# Patient Record
Sex: Male | Born: 1990 | Race: White | Hispanic: No | Marital: Married | Smoking: Never smoker
Health system: Southern US, Community
[De-identification: ages and names within clinical notes are randomized; demographics above are authoritative.]

## PROBLEM LIST (undated history)

## (undated) DIAGNOSIS — F419 Anxiety disorder, unspecified: Secondary | ICD-10-CM

## (undated) DIAGNOSIS — R519 Headache, unspecified: Secondary | ICD-10-CM

## (undated) DIAGNOSIS — I10 Essential (primary) hypertension: Secondary | ICD-10-CM

## (undated) DIAGNOSIS — F32A Depression, unspecified: Secondary | ICD-10-CM

## (undated) DIAGNOSIS — G4730 Sleep apnea, unspecified: Secondary | ICD-10-CM

---

## 2015-01-22 ENCOUNTER — Encounter: Payer: Self-pay | Admitting: Emergency Medicine

## 2015-01-22 ENCOUNTER — Emergency Department
Admission: EM | Admit: 2015-01-22 | Discharge: 2015-01-23 | Disposition: A | Discharge status: Home / Self Care | Payer: No Typology Code available for payment source | Attending: Emergency Medicine | Admitting: Emergency Medicine

## 2015-01-22 DIAGNOSIS — R509 Fever, unspecified: Secondary | ICD-10-CM | POA: Diagnosis present

## 2015-01-22 DIAGNOSIS — J020 Streptococcal pharyngitis: Secondary | ICD-10-CM | POA: Insufficient documentation

## 2015-01-22 MED ORDER — ACETAMINOPHEN 500 MG PO TABS
1000.0000 mg | ORAL_TABLET | Freq: Once | ORAL | Status: AC
  Administered 2015-01-22: 1000 mg via ORAL
  Filled 2015-01-22: qty 2

## 2015-01-22 MED ORDER — DEXAMETHASONE SODIUM PHOSPHATE 10 MG/ML IJ SOLN
10.0000 mg | Freq: Once | INTRAMUSCULAR | Status: AC
  Administered 2015-01-22: 10 mg via INTRAMUSCULAR
  Filled 2015-01-22: qty 1

## 2015-01-22 MED ORDER — PENICILLIN G BENZATHINE 1200000 UNIT/2ML IM SUSP
1.2000 10*6.[IU] | Freq: Once | INTRAMUSCULAR | Status: AC
  Administered 2015-01-22: 1.2 10*6.[IU] via INTRAMUSCULAR
  Filled 2015-01-22: qty 2

## 2015-01-22 NOTE — ED Notes (Signed)
Pt presents to ED via POV with c/o of sore throat, fever, and generalized body aches. Pt states he has had these presenting sx since yesterday. Pt states he has taken OTC medications for the sx, with no relief. Pt is alert and oriented x4. Pt states he believes he has the "flu".

## 2015-01-23 NOTE — Discharge Instructions (Signed)
Fever, Adult A fever is a temperature of 100.4 F (38 C) or above.  HOME CARE  Take fever medicine as told by your doctor. Do not  take aspirin for fever if you are younger than 24 years of age.  If you are given antibiotic medicine, take it as told. Finish the medicine even if you start to feel better.  Rest.  Drink enough fluids to keep your pee (urine) clear or pale yellow. Do not drink alcohol.  Take a bath or shower with room temperature water. Do not use ice water or alcohol sponge baths.  Wear lightweight, loose clothes. GET HELP RIGHT AWAY IF:   You are short of breath or have trouble breathing.  You are very weak.  You are dizzy or you pass out (faint).  You are very thirsty or are making little or no urine.  You have new pain.  You throw up (vomit) or have watery poop (diarrhea).  You keep throwing up or having watery poop for more than 1 to 2 days.  You have a stiff neck or light bothers your eyes.  You have a skin rash.  You have a fever or problems (symptoms) that last for more than 2 to 3 days.  You have a fever and your problems quickly get worse.  You keep throwing up the fluids you drink.  You do not feel better after 3 days.  You have new problems. MAKE SURE YOU:   Understand these instructions.  Will watch your condition.  Will get help right away if you are not doing well or get worse. Document Released: 03/28/2008 Document Revised: 09/11/2011 Document Reviewed: 04/20/2011 [facility] Patient Information 2015 [place], [place]. This information is not intended to replace advice given to you by your health care provider. Make sure you discuss any questions you have with your health care provider.  Pharyngitis Pharyngitis is a sore throat (pharynx). There is redness, pain, and swelling of your throat. HOME CARE   Drink enough fluids to keep your pee (urine) clear or pale yellow.  Only take medicine as told by your doctor.  You may get sick  again if you do not take medicine as told. Finish your medicines, even if you start to feel better.  Do not take aspirin.  Rest.  Rinse your mouth (gargle) with salt water ( tsp of salt per 1 qt of water) every 1-2 hours. This will help the pain.  If you are not at risk for choking, you can suck on hard candy or sore throat lozenges. GET HELP IF:  You have large, tender lumps on your neck.  You have a rash.  You cough up green, yellow-brown, or bloody spit. GET HELP RIGHT AWAY IF:   You have a stiff neck.  You drool or cannot swallow liquids.  You throw up (vomit) or are not able to keep medicine or liquids down.  You have very bad pain that does not go away with medicine.  You have problems breathing (not from a stuffy nose). MAKE SURE YOU:   Understand these instructions.  Will watch your condition.  Will get help right away if you are not doing well or get worse. Document Released: 12/06/2007 Document Revised: 04/09/2013 Document Reviewed: 02/24/2013 [facility] Patient Information 2015 [place], [place]. This information is not intended to replace advice given to you by your health care provider. Make sure you discuss any questions you have with your health care provider.

## 2015-01-23 NOTE — ED Provider Notes (Signed)
CSN: [id number]     Arrival date & time 01/22/15  2024 History   First MD Initiated Contact with Patient 01/22/15 2323     Chief Complaint  Patient presents with  . Generalized Body Aches  . Sore Throat  . Fever     (Consider location/radiation/quality/duration/timing/severity/associated sxs/prior Treatment) HPI 24 year old male with 2 day history of sore throat and fevers and body aches. Patient has had fevers to 102. He was began today. Sore throat pain is 8 out of 10. He has not taken Tylenol or ibuprofen. He is able tolerate by mouth well. No abdominal pain, pain, shortness of breath, rashes.   History reviewed. No pertinent past medical history. No past surgical history on file. No family history on file. History  Substance Use Topics  . Smoking status: Never Smoker   . Smokeless tobacco: Not on file  . Alcohol Use: Yes    Review of Systems  Constitutional: Positive for fever. Negative for chills, activity change and appetite change.  HENT: Positive for sore throat. Negative for congestion, ear pain, mouth sores, rhinorrhea, sinus pressure and trouble swallowing.   Eyes: Negative for photophobia, pain and discharge.  Respiratory: Negative for cough, chest tightness and shortness of breath.   Cardiovascular: Negative for chest pain and leg swelling.  Gastrointestinal: Negative for nausea, vomiting, abdominal pain, diarrhea and abdominal distention.  Genitourinary: Negative for dysuria and difficulty urinating.  Musculoskeletal: Negative for back pain, arthralgias and gait problem.  Skin: Negative for color change and rash.  Neurological: Negative for dizziness and headaches.  Hematological: Negative for adenopathy.  Psychiatric/Behavioral: Negative for behavioral problems and agitation.      Allergies  Soy allergy  Home Medications   Prior to Admission medications   Not on File   BP 115/65 mmHg  Pulse 98  Temp(Src) 99.4 F (37.4 C) (Oral)  Resp 20  Ht 5\' 7"   (1.702 m)  Wt 170 lb (77.111 kg)  BMI 26.62 kg/m2  SpO2 98% Physical Exam  Constitutional: He is oriented to person, place, and time. He appears well-developed and well-nourished.  HENT:  Head: Normocephalic and atraumatic.  Mouth/Throat: Uvula is midline and mucous membranes are normal. No oral lesions. No trismus in the jaw. Normal dentition. No dental abscesses, uvula swelling or dental caries. Oropharyngeal exudate, posterior oropharyngeal edema and posterior oropharyngeal erythema present. No tonsillar abscesses.  Eyes: Conjunctivae and EOM are normal. Pupils are equal, round, and reactive to light.  Neck: Normal range of motion. Neck supple.  Cardiovascular: Normal rate, regular rhythm, normal heart sounds and intact distal pulses.   Pulmonary/Chest: Effort normal and breath sounds normal. No respiratory distress. He has no wheezes. He has no rales. He exhibits no tenderness.  Abdominal: Soft. Bowel sounds are normal. He exhibits no distension. There is no tenderness.  Musculoskeletal: Normal range of motion. He exhibits no edema or tenderness.  Neurological: He is alert and oriented to person, place, and time.  Skin: Skin is warm and dry.  Psychiatric: He has a normal mood and affect. His behavior is normal. Judgment and thought content normal.    ED Course  Procedures (including critical care time) Labs Review Labs Reviewed - No data to display  Imaging Review No results found.   EKG Interpretation None      MDM   Final diagnoses:  Strep pharyngitis  Fever, unspecified fever cause    24 year old male with fevers and pharyngitis 2 days. Rapid strep test was positive. Fever was proved with Tylenol. Patient  was given 10 mg of dexamethasone IM followed by 1.2 million units of penicillin G IM. He'll continue Tylenol and ibuprofen as needed for fevers. Turn to the ER for any worsening symptoms or urgent changes in his health.    Evon Slack, PA-C 01/23/15  0021  Myrna Blazer, MD 01/23/15 [phone]

## 2015-01-26 LAB — POCT RAPID STREP A: Streptococcus, Group A Screen (Direct): POSITIVE — AB

## 2018-11-19 DIAGNOSIS — G43709 Chronic migraine without aura, not intractable, without status migrainosus: Secondary | ICD-10-CM | POA: Insufficient documentation

## 2019-10-26 ENCOUNTER — Emergency Department: Payer: 59

## 2019-10-26 ENCOUNTER — Other Ambulatory Visit: Payer: Self-pay

## 2019-10-26 DIAGNOSIS — I10 Essential (primary) hypertension: Secondary | ICD-10-CM | POA: Insufficient documentation

## 2019-10-26 DIAGNOSIS — R05 Cough: Secondary | ICD-10-CM | POA: Diagnosis present

## 2019-10-26 DIAGNOSIS — U071 COVID-19: Secondary | ICD-10-CM | POA: Insufficient documentation

## 2019-10-26 LAB — CBC
HCT: 41.7 % (ref 39.0–52.0)
Hemoglobin: 14.9 g/dL (ref 13.0–17.0)
MCH: 31.6 pg (ref 26.0–34.0)
MCHC: 35.7 g/dL (ref 30.0–36.0)
MCV: 88.3 fL (ref 80.0–100.0)
Platelets: 141 10*3/uL — ABNORMAL LOW (ref 150–400)
RBC: 4.72 MIL/uL (ref 4.22–5.81)
RDW: 11.7 % (ref 11.5–15.5)
WBC: 5.4 10*3/uL (ref 4.0–10.5)
nRBC: 0 % (ref 0.0–0.2)

## 2019-10-26 LAB — BASIC METABOLIC PANEL
Anion gap: 8 (ref 5–15)
BUN: 11 mg/dL (ref 6–20)
CO2: 28 mmol/L (ref 22–32)
Calcium: 8.5 mg/dL — ABNORMAL LOW (ref 8.9–10.3)
Chloride: 102 mmol/L (ref 98–111)
Creatinine, Ser: 0.94 mg/dL (ref 0.61–1.24)
GFR calc Af Amer: >60 mL/min (ref >60)
GFR calc non Af Amer: >60 mL/min (ref >60)
Glucose, Bld: 105 mg/dL — ABNORMAL HIGH (ref 70–99)
Potassium: 3.2 mmol/L — ABNORMAL LOW (ref 3.5–5.1)
Sodium: 138 mmol/L (ref 135–145)

## 2019-10-26 LAB — TROPONIN I (HIGH SENSITIVITY): Troponin I (High Sensitivity): <2 ng/L (ref <18)

## 2019-10-26 NOTE — ED Triage Notes (Signed)
Pt reports that he has been experiencing a fever and cough since Friday. PT states that his Wife and children are at home with COVId-19. Pt states that he comes to ED today due to inability to lower fever at home, pt reports fever of 100.2 and up, states he has taken ibuprofen and cold showers to help with fever with no success. Pt also reports some chest tightness with cough.

## 2019-10-27 ENCOUNTER — Emergency Department
Admission: EM | Admit: 2019-10-27 | Discharge: 2019-10-27 | Disposition: A | Discharge status: Home / Self Care | Payer: 59 | Attending: Emergency Medicine | Admitting: Emergency Medicine

## 2019-10-27 DIAGNOSIS — U071 COVID-19: Secondary | ICD-10-CM

## 2019-10-27 HISTORY — DX: Essential (primary) hypertension: I10

## 2019-10-27 LAB — POC SARS CORONAVIRUS 2 AG: SARS Coronavirus 2 Ag: POSITIVE — AB

## 2019-10-27 LAB — TROPONIN I (HIGH SENSITIVITY): Troponin I (High Sensitivity): <2 ng/L (ref <18)

## 2019-10-27 LAB — FIBRIN DERIVATIVES D-DIMER (ARMC ONLY): Fibrin derivatives D-dimer (ARMC): 278.72 ng/mL (FEU) (ref 0.00–499.00)

## 2019-10-27 MED ORDER — BENZONATATE 100 MG PO CAPS: 100.0000 mg | ORAL_CAPSULE | Freq: Three times a day (TID) | ORAL | 0 refills | Status: DC | PRN

## 2019-10-27 MED ORDER — ONDANSETRON 4 MG PO TBDP: 4.0000 mg | ORAL_TABLET | Freq: Three times a day (TID) | ORAL | 0 refills | Status: DC | PRN

## 2019-10-27 MED ORDER — IBUPROFEN 600 MG PO TABS
600.0000 mg | ORAL_TABLET | Freq: Once | ORAL | Status: AC
  Administered 2019-10-27: 600 mg via ORAL
  Filled 2019-10-27: qty 1

## 2019-10-27 NOTE — ED Provider Notes (Signed)
[facility] Emergency Department Provider Note  ____________________________________________  Time seen: Approximately 3:50 AM  I have reviewed the triage vital signs and the nursing notes.   HISTORY  Chief Complaint Cough and Fever   HPI Shawn Medina is a 29 y.o. male the history of hypertension who presents for evaluation of Covid-like symptoms.  Patient reports that his wife and children tested positive for Covid.  He started having symptoms 2 days ago.  He has had fever, cough, chest tightness, body aches.  He denies pleuritic chest pain, vomiting or diarrhea, shortness of breath.  No personal or family history of blood clots, no leg pain or swelling or hemoptysis.  Has been taking Tylenol and ibuprofen for fever.  Has not been tested for Covid.   Past Medical History:  Diagnosis Date  . Hypertension     Allergies Soy allergy  History reviewed. No pertinent family history.  Social History Social History   Tobacco Use  . Smoking status: Never Smoker  . Smokeless tobacco: Never Used  Substance Use Topics  . Alcohol use: Never  . Drug use: No    Review of Systems  Constitutional: + fever. Eyes: Negative for visual changes. ENT: Negative for sore throat. Neck: No neck pain  Cardiovascular: Negative for chest pain. + chest tightness Respiratory: Negative for shortness of breath. + cough Gastrointestinal: Negative for abdominal pain, vomiting or diarrhea. Genitourinary: Negative for dysuria. Musculoskeletal: Negative for back pain. Skin: Negative for rash. Neurological: Negative for headaches, weakness or numbness. Psych: No SI or HI  ____________________________________________   PHYSICAL EXAM:  VITAL SIGNS: ED Triage Vitals  Enc Vitals Group     BP 10/26/19 2204 (!) 145/82     Pulse Rate 10/26/19 2204 (!) 102     Resp 10/26/19 2204 (!) 22     Temp 10/26/19 2204 100.2 F (37.9 C)     Temp Source 10/26/19 2204 Oral      SpO2 10/26/19 2204 99 %     Weight 10/26/19 2205 210 lb (95.3 kg)     Height 10/26/19 2205 5\' 8"  (1.727 m)     Head Circumference --      Peak Flow --      Pain Score 10/26/19 2204 5     Pain Loc --      Pain Edu? --      Excl. in GC? --     Constitutional: Alert and oriented. Well appearing and in no apparent distress. HEENT:      Head: Normocephalic and atraumatic.         Eyes: Conjunctivae are normal. Sclera is non-icteric.       Mouth/Throat: Mucous membranes are moist.       Neck: Supple with no signs of meningismus. Cardiovascular: Tachycardic with regular rhythm with no murmurs, no JVD. Respiratory: Normal respiratory effort. Lungs are clear to auscultation bilaterally. No wheezes, crackles, or rhonchi.  Gastrointestinal: Soft, non tender. Musculoskeletal:  No edema, cyanosis, or erythema of extremities. Neurologic: Normal speech and language. Face is symmetric. Moving all extremities. No gross focal neurologic deficits are appreciated. Skin: Skin is warm, dry and intact. No rash noted. Psychiatric: Mood and affect are normal. Speech and behavior are normal.  ____________________________________________   LABS (all labs ordered are listed, but only abnormal results are displayed)  Labs Reviewed  BASIC METABOLIC PANEL - Abnormal; Notable for the following components:      Result Value   Potassium 3.2 (*)  Glucose, Bld 105 (*)    Calcium 8.5 (*)    All other components within normal limits  CBC - Abnormal; Notable for the following components:   Platelets 141 (*)    All other components within normal limits  POC SARS CORONAVIRUS 2 AG - Abnormal; Notable for the following components:   SARS Coronavirus 2 Ag POSITIVE (*)    All other components within normal limits  FIBRIN DERIVATIVES D-DIMER ([facility] ONLY)  POC SARS CORONAVIRUS 2 AG -  ED  TROPONIN I (HIGH SENSITIVITY)  TROPONIN I (HIGH SENSITIVITY)   ____________________________________________  EKG  ED ECG  REPORT I, Rudene Re, the attending physician, personally viewed and interpreted this ECG.  Normal sinus rhythm, rate of 97, normal intervals, right axis deviation, Q waves in inferior leads with T wave inversions, no ST elevation.  No prior for comparison. ____________________________________________  RADIOLOGY  I have personally reviewed the images performed during this visit and I agree with the Radiologist's read.   Interpretation by Radiologist:  DG Chest 2 View  Result Date: 10/26/2019 CLINICAL DATA:  Fever and cough. EXAM: CHEST - 2 VIEW COMPARISON:  None. FINDINGS: The heart size and mediastinal contours are within normal limits. Both lungs are clear. The visualized skeletal structures are unremarkable. IMPRESSION: No active cardiopulmonary disease. Electronically Signed   By: Virgina Norfolk M.D.   On: 10/26/2019 22:29     ____________________________________________   PROCEDURES  Procedure(s) performed:yes .1-3 Lead EKG Interpretation Performed by: Rudene Re, MD Authorized by: Rudene Re, MD     Interpretation: abnormal     ECG rate:  90   ECG rate assessment: normal     Rhythm: sinus rhythm     Ectopy: none     Conduction: normal     Critical Care performed:  None ____________________________________________   INITIAL IMPRESSION / ASSESSMENT AND PLAN / ED COURSE  29 y.o. male the history of hypertension who presents for evaluation of Covid-like symptoms.  Family tested positive for Covid.  Patient is extremely well-appearing in no distress, has a low-grade fever and mild tachycardia but normal work of breathing, normal sats and clear lungs.  No asymmetric leg swelling.  He looks euvolemic.  EKG showing inferior Q waves with T wave inversions but no prior for comparison.  No evidence of STEMI.  High-sensitivity troponin x2 -with no signs of Covid myocarditis.  Chest x-ray visualized and read by me as negative with no signs of Covid  pneumonia, confirmed by radiology.  No leukocytosis, mild thrombocytopenia with platelets of 141.  No significant electrolyte derangements.  D-dimer negative.  Covid swab +. No oxygen requirement both at rest and with ambulation.  Discussed management of symptoms at home, monitoring of oxygen saturations several times a day, follow-up with PCP, and my standard return precautions.  Will provide patient with Tessalon Perles, Zofran.  Old medical records reviewed.      _____________________________________________ Please note:  Patient was evaluated in Emergency Department today for the symptoms described in the history of present illness. Patient was evaluated in the context of the global COVID-19 pandemic, which necessitated consideration that the patient might be at risk for infection with the SARS-CoV-2 virus that causes COVID-19. Institutional protocols and algorithms that pertain to the evaluation of patients at risk for COVID-19 are in a state of rapid change based on information released by regulatory bodies including the CDC and federal and state organizations. These policies and algorithms were followed during the patient's care in the ED.  Some ED evaluations and interventions may be delayed as a result of limited staffing during the pandemic.   [place] Controlled Substance Database was reviewed by me. ____________________________________________   FINAL CLINICAL IMPRESSION(S) / ED DIAGNOSES   Final diagnoses:  COVID-19      NEW MEDICATIONS STARTED DURING THIS VISIT:  ED Discharge Orders         Ordered    benzonatate (TESSALON PERLES) 100 MG capsule  3 times daily PRN     10/27/19 0459    ondansetron (ZOFRAN ODT) 4 MG disintegrating tablet  Every 8 hours PRN     10/27/19 0459           Note:  This document was prepared using Dragon voice recognition software and may include unintentional dictation errors.    Don Perking, [place], MD 10/27/19 [phone]

## 2019-10-27 NOTE — Discharge Instructions (Signed)
QUARANTINE INSTRUCTION  Follow these instructions at home:  Protecting others To avoid spreading the illness to other people: Quarantine in your home until you have had no cough and fever for 7 days. Household members should also be quarantine for at least 14 days after being exposed to you. Wash your hands often with soap and water. If soap and water are not available, use an alcohol-based hand sanitizer. If you have not cleaned your hands, do not touch your face. Make sure that all people in your household wash their hands well and often. Cover your nose and mouth when you cough or sneeze. Throw away used tissues. Stay home if you have any cold-like or flu-like symptoms. General instructions Go to your local pharmacy and buy a pulse oximeter (this is a machine that measures your oxygen). Check your oxygen levels at least 3 times a day. If your oxygen level is 90% or less return to the emergency room immediately Take over-the-counter and prescription medicines only as told by your health care provider. If you need medication for fever take tylenol or ibuprofen Drink enough fluid to keep your urine pale yellow. Rest at home as directed by your health care provider. Do not give aspirin to a child with the flu, because of the association with Reye's syndrome. Do not use tobacco products, including cigarettes, chewing tobacco, and e-cigarettes. If you need help quitting, ask your health care provider. Keep all follow-up visits as told by your health care provider. This is important. How is this prevented? Avoid areas where an outbreak has been reported. Avoid large groups of people. Keep a safe distance from people who are coughing and sneezing. Do not touch your face if you have not cleaned your hands. When you are around people who are sick or might be sick, wear a mask to protect yourself. Contact a health care provider if: You have symptoms of SARS (cough, fever, chest pain, shortness of  breath) that are not getting better at home. You have a fever. If you have difficulty breathing go to your local ER or call 911   

## 2020-01-07 ENCOUNTER — Encounter: Payer: Self-pay | Admitting: *Deleted

## 2020-01-19 ENCOUNTER — Other Ambulatory Visit: Payer: Self-pay

## 2020-01-19 ENCOUNTER — Encounter: Payer: Self-pay | Admitting: Cardiology

## 2020-01-19 ENCOUNTER — Ambulatory Visit (INDEPENDENT_AMBULATORY_CARE_PROVIDER_SITE_OTHER): Payer: No Typology Code available for payment source | Admitting: Cardiology

## 2020-01-19 VITALS — BP 118/90 | HR 66 | Ht 68.0 in | Wt 204.1 lb

## 2020-01-19 DIAGNOSIS — I10 Essential (primary) hypertension: Secondary | ICD-10-CM

## 2020-01-19 DIAGNOSIS — R079 Chest pain, unspecified: Secondary | ICD-10-CM | POA: Diagnosis not present

## 2020-01-19 NOTE — Progress Notes (Signed)
Cardiology Office Note:    Date:  01/19/2020   ID:  Shawn Medina, DOB 1991/05/16, MRN [id number]  PCP:  Patient, No Pcp Per  [facility] Cardiologist:  Debbe Odea, MD  [facility] Electrophysiologist:  None   Referring MD: [facility]   Chief Complaint  Patient presents with   New Patient (Initial Visit)    Ref by Dr. Sande Brothers for HTN & chest pain. Meds reviewed by the pt. verbally. Pt. c/o chest pain today.    Shawn Medina is a 29 y.o. male who is being seen today for the evaluation of chestpain at the request of [facility].   History of Present Illness:    Shawn Medina is a 29 y.o. male with a hx of hypertension who presents due to chest pain.  Patient states having symptoms of chest discomfort over the past 3 months.  Symptoms of chest pain are not related with exertion and usually lasts a couple of seconds.  Symptoms are kind of dull.  Patient is in the [employer] and recently stationed in [place].  He is not sure if he is recent department or anxiety is contributing to his symptoms.  He states having right hip arthritis and recently started physical therapy to help with that.  Symptoms of chest discomfort.  He denies palpitations, shortness of breath, edema.  Denies any history of heart disease or family history of heart disease.  Denies smoking.  Past Medical History:  Diagnosis Date   Hypertension     History reviewed. No pertinent surgical history.  Current Medications: Current Meds  Medication Sig   cholecalciferol (VITAMIN D3) 25 MCG (1000 UNIT) tablet Take 1,000 Units by mouth daily.   ibuprofen (ADVIL) 400 MG tablet Take 400 mg by mouth every 6 (six) hours as needed.   ondansetron (ZOFRAN ODT) 4 MG disintegrating tablet Take 1 tablet (4 mg total) by mouth every 8 (eight) hours as needed.   propranolol (INDERAL) 60 MG tablet Take 60 mg by mouth daily.     Allergies:   Soy allergy   Social History    Socioeconomic History   Marital status: Married    Spouse name: Not on file   Number of children: Not on file   Years of education: Not on file   Highest education level: Not on file  Occupational History   Not on file  Tobacco Use   Smoking status: Never Smoker   Smokeless tobacco: Never Used  Vaping Use   Vaping Use: Never used  Substance and Sexual Activity   Alcohol use: Never   Drug use: No   Sexual activity: Not on file  Other Topics Concern   Not on file  Social History Narrative   Not on file   Social Determinants of Health   Financial Resource Strain:    Difficulty of Paying Living Expenses:   Food Insecurity:    Worried About Programme researcher, broadcasting/film/video in the Last Year:    Barista in the Last Year:   Transportation Needs:    Freight forwarder (Medical):    Lack of Transportation (Non-Medical):   Physical Activity:    Days of Exercise per Week:    Minutes of Exercise per Session:   Stress:    Feeling of Stress :   Social Connections:    Frequency of Communication with Friends and Family:    Frequency of Social Gatherings with Friends and Family:  Attends Religious Services:    Active Member of Clubs or Organizations:    Attends BankerClub or Organization Meetings:    Marital Status:      Family History: Patient denies any family history of heart disease.  ROS:   Please see the history of present illness.     All other systems reviewed and are negative.  EKGs/Labs/Other Studies Reviewed:    The following studies were reviewed today:   EKG:  EKG is  ordered today.  The ekg ordered today demonstrates normal sinus rhythm, normal ECG.  Recent Labs: 10/26/2019: BUN 11; Creatinine, Ser 0.94; Hemoglobin 14.9; Platelets 141; Potassium 3.2; Sodium 138  Recent Lipid Panel No results found for: CHOL, TRIG, HDL, CHOLHDL, VLDL, LDLCALC, LDLDIRECT  Physical Exam:    VS:  BP 118/90 (BP Location: Right Arm, Patient Position:  Sitting, Cuff Size: Normal)    Pulse 66    Ht 5\' 8"  (1.727 m)    Wt 204 lb 2 oz (92.6 kg)    SpO2 98%    BMI 31.04 kg/m     Wt Readings from Last 3 Encounters:  01/19/20 204 lb 2 oz (92.6 kg)  10/26/19 210 lb (95.3 kg)  01/22/15 170 lb (77.1 kg)     GEN:  Well nourished, well developed in no acute distress HEENT: Normal NECK: No JVD; No carotid bruits LYMPHATICS: No lymphadenopathy CARDIAC: RRR, no murmurs, rubs, gallops RESPIRATORY:  Clear to auscultation without rales, wheezing or rhonchi  ABDOMEN: Soft, non-tender, non-distended MUSCULOSKELETAL:  No edema; No deformity  SKIN: Warm and dry NEUROLOGIC:  Alert and oriented x 3 PSYCHIATRIC:  Normal affect   ASSESSMENT:    1. Chest pain of uncertain etiology   2. Essential hypertension   3. Chest pain, unspecified type    PLAN:    In order of problems listed above:  1. Patient with symptoms of atypical chest discomfort.  He has risk factors of hypertension.  He is in the [employer].  Will evaluate patient with echocardiogram for wall motion abnormalities.  Get a Lexiscan Myoview to evaluate presence of CAD. 2. History of hypertension, blood pressure well controlled.  Continue propanolol as prescribed.  Follow-up after echocardiogram and Myoview.   Medication Adjustments/Labs and Tests Ordered: Current medicines are reviewed at length with the patient today.  Concerns regarding medicines are outlined above.  Orders Placed This Encounter  Procedures   NM Myocar Multi W/Spect W/Wall Motion / EF   EKG 12-Lead   ECHOCARDIOGRAM COMPLETE   No orders of the defined types were placed in this encounter.   Patient Instructions  Medication Instructions:   Your physician recommends that you continue on your current medications as directed. Please refer to the Current Medication list given to you today.  *If you need a refill on your cardiac medications before your next appointment, please call your pharmacy*   Lab  Work: None Ordered If you have labs (blood work) drawn today and your tests are completely normal, you will receive your results only by:  MyChart Message (if you have MyChart) OR  A paper copy in the mail If you have any lab test that is abnormal or we need to change your treatment, we will call you to review the results.   Testing/Procedures:  1.  Your physician has requested that you have an echocardiogram. Echocardiography is a painless test that uses sound waves to create images of your heart. It provides your doctor with information about the size and shape of your  heart and how well your hearts chambers and valves are working. This procedure takes approximately one hour. There are no restrictions for this procedure.  2.  Your physician has requested that you have a lexiscan myoview.      [facility] MYOVIEW  Your caregiver has ordered a Stress Test with nuclear imaging. The purpose of this test is to evaluate the blood supply to your heart muscle. This procedure is referred to as a "Non-Invasive Stress Test." This is because other than having an IV started in your vein, nothing is inserted or "invades" your body. Cardiac stress tests are done to find areas of poor blood flow to the heart by determining the extent of coronary artery disease (CAD). Some patients exercise on a treadmill, which naturally increases the blood flow to your heart, while others who are  unable to walk on a treadmill due to physical limitations have a pharmacologic/chemical stress agent called Lexiscan . This medicine will mimic walking on a treadmill by temporarily increasing your coronary blood flow.    Please note: these test may take anywhere between 2-4 hours to complete   PLEASE REPORT TO [facility] MEDICAL MALL ENTRANCE  THE VOLUNTEERS AT THE FIRST DESK WILL DIRECT YOU WHERE TO GO  Date of Procedure:_____________________________________  Arrival Time for Procedure:______________________________  Instructions  regarding medication:     PLEASE NOTIFY THE OFFICE AT LEAST 24 HOURS IN ADVANCE IF YOU ARE UNABLE TO KEEP YOUR APPOINTMENT.  [phone] AND  PLEASE NOTIFY NUCLEAR MEDICINE AT [facility] AT LEAST 24 HOURS IN ADVANCE IF YOU ARE UNABLE TO KEEP YOUR APPOINTMENT. [phone]  How to prepare for your Myoview test:   __XX__:  Hold betablocker(s) night before procedure and morning of procedure:  propranolol (INDERAL)   1. Do not eat or drink after midnight 2. No caffeine for 24 hours prior to test 3. No smoking 24 hours prior to test. 4. Your medication may be taken with water.  If your doctor stopped a medication because of this test, do not take that medication. 5. No perfume, cologne or lotion. 6. Wear comfortable walking shoes. No heels!     Follow-Up: At [facility], you and your health needs are our priority.  As part of our continuing mission to provide you with exceptional heart care, we have created designated Provider Care Teams.  These Care Teams include your primary Cardiologist (physician) and Advanced Practice Providers (APPs -  Physician Assistants and Nurse Practitioners) who all work together to provide you with the care you need, when you need it.  We recommend signing up for the patient portal called "MyChart".  Sign up information is provided on this After Visit Summary.  MyChart is used to connect with patients for Virtual Visits (Telemedicine).  Patients are able to view lab/test results, encounter notes, upcoming appointments, etc.  Non-urgent messages can be sent to your provider as well.   To learn more about what you can do with MyChart, go to [email or web address].    Your next appointment:    After Echo and Myoview  The format for your next appointment:   In Person  Provider:   Debbe Odea, MD   Other Instructions   Echocardiogram An echocardiogram is a procedure that uses painless sound waves (ultrasound) to produce an image of the heart. Images  from an echocardiogram can provide important information about:  Signs of coronary artery disease (CAD).  Aneurysm detection. An aneurysm is a weak or damaged part of an artery wall that bulges out  from the normal force of blood pumping through the body.  Heart size and shape. Changes in the size or shape of the heart can be associated with certain conditions, including heart failure, aneurysm, and CAD.  Heart muscle function.  Heart valve function.  Signs of a past heart attack.  Fluid buildup around the heart.  Thickening of the heart muscle.  A tumor or infectious growth around the heart valves. Tell a health care provider about:  Any allergies you have.  All medicines you are taking, including vitamins, herbs, eye drops, creams, and over-the-counter medicines.  Any blood disorders you have.  Any surgeries you have had.  Any medical conditions you have.  Whether you are pregnant or may be pregnant. What are the risks? Generally, this is a safe procedure. However, problems may occur, including:  Allergic reaction to dye (contrast) that may be used during the procedure. What happens before the procedure? No specific preparation is needed. You may eat and drink normally. What happens during the procedure?   An IV tube may be inserted into one of your veins.  You may receive contrast through this tube. A contrast is an injection that improves the quality of the pictures from your heart.  A gel will be applied to your chest.  A wand-like tool (transducer) will be moved over your chest. The gel will help to transmit the sound waves from the transducer.  The sound waves will harmlessly bounce off of your heart to allow the heart images to be captured in real-time motion. The images will be recorded on a computer. The procedure may vary among health care providers and hospitals. What happens after the procedure?  You may return to your normal, everyday life, including  diet, activities, and medicines, unless your health care provider tells you not to do that. Summary  An echocardiogram is a procedure that uses painless sound waves (ultrasound) to produce an image of the heart.  Images from an echocardiogram can provide important information about the size and shape of your heart, heart muscle function, heart valve function, and fluid buildup around your heart.  You do not need to do anything to prepare before this procedure. You may eat and drink normally.  After the echocardiogram is completed, you may return to your normal, everyday life, unless your health care provider tells you not to do that. This information is not intended to replace advice given to you by your health care provider. Make sure you discuss any questions you have with your health care provider. Document Revised: 10/10/2018 Document Reviewed: 07/22/2016 Elsevier Patient Education  2020 [employer].      Signed, Debbe Odea, MD  01/19/2020 5:31 PM    [facility]

## 2020-01-19 NOTE — Patient Instructions (Signed)
Medication Instructions:   Your physician recommends that you continue on your current medications as directed. Please refer to the Current Medication list given to you today.  *If you need a refill on your cardiac medications before your next appointment, please call your pharmacy*   Lab Work: None Ordered If you have labs (blood work) drawn today and your tests are completely normal, you will receive your results only by: Marland Kitchen MyChart Message (if you have MyChart) OR . A paper copy in the mail If you have any lab test that is abnormal or we need to change your treatment, we will call you to review the results.   Testing/Procedures:  1.  Your physician has requested that you have an echocardiogram. Echocardiography is a painless test that uses sound waves to create images of your heart. It provides your doctor with information about the size and shape of your heart and how well your heart's chambers and valves are working. This procedure takes approximately one hour. There are no restrictions for this procedure.  2.  Your physician has requested that you have a lexiscan myoview.      [facility] MYOVIEW  Your caregiver has ordered a Stress Test with nuclear imaging. The purpose of this test is to evaluate the blood supply to your heart muscle. This procedure is referred to as a "Non-Invasive Stress Test." This is because other than having an IV started in your vein, nothing is inserted or "invades" your body. Cardiac stress tests are done to find areas of poor blood flow to the heart by determining the extent of coronary artery disease (CAD). Some patients exercise on a treadmill, which naturally increases the blood flow to your heart, while others who are  unable to walk on a treadmill due to physical limitations have a pharmacologic/chemical stress agent called Lexiscan . This medicine will mimic walking on a treadmill by temporarily increasing your coronary blood flow.    Please note: these  test may take anywhere between 2-4 hours to complete   PLEASE REPORT TO [facility] MEDICAL MALL ENTRANCE  THE VOLUNTEERS AT THE FIRST DESK WILL DIRECT YOU WHERE TO GO  Date of Procedure:_____________________________________  Arrival Time for Procedure:______________________________  Instructions regarding medication:     PLEASE NOTIFY THE OFFICE AT LEAST 24 HOURS IN ADVANCE IF YOU ARE UNABLE TO KEEP YOUR APPOINTMENT.  [phone] AND  PLEASE NOTIFY NUCLEAR MEDICINE AT [facility] AT LEAST 24 HOURS IN ADVANCE IF YOU ARE UNABLE TO KEEP YOUR APPOINTMENT. [phone]  How to prepare for your Myoview test:   __XX__:  Hold betablocker(s) night before procedure and morning of procedure:  propranolol (INDERAL)   1. Do not eat or drink after midnight 2. No caffeine for 24 hours prior to test 3. No smoking 24 hours prior to test. 4. Your medication may be taken with water.  If your doctor stopped a medication because of this test, do not take that medication. 5. No perfume, cologne or lotion. 6. Wear comfortable walking shoes. No heels!     Follow-Up: At [facility], you and your health needs are our priority.  As part of our continuing mission to provide you with exceptional heart care, we have created designated Provider Care Teams.  These Care Teams include your primary Cardiologist (physician) and Advanced Practice Providers (APPs -  Physician Assistants and Nurse Practitioners) who all work together to provide you with the care you need, when you need it.  We recommend signing up for the patient portal called "  MyChart".  Sign up information is provided on this After Visit Summary.  MyChart is used to connect with patients for Virtual Visits (Telemedicine).  Patients are able to view lab/test results, encounter notes, upcoming appointments, etc.  Non-urgent messages can be sent to your provider as well.   To learn more about what you can do with MyChart, go to [email or web address].     Your next appointment:    After Echo and Myoview  The format for your next appointment:   In Person  Provider:   Debbe Odea, MD   Other Instructions   Echocardiogram An echocardiogram is a procedure that uses painless sound waves (ultrasound) to produce an image of the heart. Images from an echocardiogram can provide important information about:  Signs of coronary artery disease (CAD).  Aneurysm detection. An aneurysm is a weak or damaged part of an artery wall that bulges out from the normal force of blood pumping through the body.  Heart size and shape. Changes in the size or shape of the heart can be associated with certain conditions, including heart failure, aneurysm, and CAD.  Heart muscle function.  Heart valve function.  Signs of a past heart attack.  Fluid buildup around the heart.  Thickening of the heart muscle.  A tumor or infectious growth around the heart valves. Tell a health care provider about:  Any allergies you have.  All medicines you are taking, including vitamins, herbs, eye drops, creams, and over-the-counter medicines.  Any blood disorders you have.  Any surgeries you have had.  Any medical conditions you have.  Whether you are pregnant or may be pregnant. What are the risks? Generally, this is a safe procedure. However, problems may occur, including:  Allergic reaction to dye (contrast) that may be used during the procedure. What happens before the procedure? No specific preparation is needed. You may eat and drink normally. What happens during the procedure?   An IV tube may be inserted into one of your veins.  You may receive contrast through this tube. A contrast is an injection that improves the quality of the pictures from your heart.  A gel will be applied to your chest.  A wand-like tool (transducer) will be moved over your chest. The gel will help to transmit the sound waves from the transducer.  The sound  waves will harmlessly bounce off of your heart to allow the heart images to be captured in real-time motion. The images will be recorded on a computer. The procedure may vary among health care providers and hospitals. What happens after the procedure?  You may return to your normal, everyday life, including diet, activities, and medicines, unless your health care provider tells you not to do that. Summary  An echocardiogram is a procedure that uses painless sound waves (ultrasound) to produce an image of the heart.  Images from an echocardiogram can provide important information about the size and shape of your heart, heart muscle function, heart valve function, and fluid buildup around your heart.  You do not need to do anything to prepare before this procedure. You may eat and drink normally.  After the echocardiogram is completed, you may return to your normal, everyday life, unless your health care provider tells you not to do that. This information is not intended to replace advice given to you by your health care provider. Make sure you discuss any questions you have with your health care provider. Document Revised: 10/10/2018 Document Reviewed: 07/22/2016 Elsevier Patient Education  2020 Elsevier Inc.   

## 2020-01-27 ENCOUNTER — Ambulatory Visit: Payer: No Typology Code available for payment source

## 2020-02-17 ENCOUNTER — Other Ambulatory Visit: Payer: Self-pay

## 2020-02-17 ENCOUNTER — Ambulatory Visit (INDEPENDENT_AMBULATORY_CARE_PROVIDER_SITE_OTHER): Payer: No Typology Code available for payment source

## 2020-02-17 DIAGNOSIS — R079 Chest pain, unspecified: Secondary | ICD-10-CM

## 2020-02-17 DIAGNOSIS — I10 Essential (primary) hypertension: Secondary | ICD-10-CM | POA: Diagnosis not present

## 2020-02-17 LAB — ECHOCARDIOGRAM COMPLETE
AR max vel: 3.78 cm2
AV Area VTI: 3.52 cm2
AV Area mean vel: 3.5 cm2
AV Mean grad: 5 mmHg
AV Peak grad: 9.4 mmHg
Ao pk vel: 1.53 m/s
Area-P 1/2: 3.54 cm2
Calc EF: 55.7 %
S' Lateral: 3.7 cm
Single Plane A2C EF: 53 %
Single Plane A4C EF: 57.3 %

## 2020-02-18 ENCOUNTER — Telehealth: Payer: Self-pay

## 2020-02-18 NOTE — Telephone Encounter (Signed)
Call to patient to review echo.    Pt verbalized understanding and has no further questions at this time.    Advised pt to call for any further questions or concerns.  No further orders.  Confirmed appt coming up later this month.

## 2020-02-18 NOTE — Telephone Encounter (Signed)
-----   Message from Debbe Odea, MD sent at 02/18/2020  2:51 PM EDT ----- Normal systolic and diastolic function. Normal echo

## 2020-02-23 ENCOUNTER — Encounter
Admission: RE | Admit: 2020-02-23 | Discharge: 2020-02-23 | Disposition: A | Discharge status: Home / Self Care | Payer: No Typology Code available for payment source | Source: Ambulatory Visit | Attending: Cardiology | Admitting: Cardiology

## 2020-02-23 ENCOUNTER — Other Ambulatory Visit: Payer: Self-pay

## 2020-02-23 DIAGNOSIS — R079 Chest pain, unspecified: Secondary | ICD-10-CM | POA: Diagnosis not present

## 2020-02-23 LAB — NM MYOCAR MULTI W/SPECT W/WALL MOTION / EF
Estimated workload: 1 METS
Exercise duration (min): 0 min
Exercise duration (sec): 0 s
LV dias vol: 120 mL (ref 62–150)
LV sys vol: 38 mL
MPHR: 191 {beats}/min
Peak HR: 102 {beats}/min
Percent HR: 53 %
Rest HR: 60 {beats}/min
SDS: 0
SRS: 1
SSS: 0
TID: 1.18

## 2020-02-23 MED ORDER — TECHNETIUM TC 99M TETROFOSMIN IV KIT
31.6800 | PACK | Freq: Once | INTRAVENOUS | Status: AC | PRN
  Administered 2020-02-23: 31.68 via INTRAVENOUS

## 2020-02-23 MED ORDER — TECHNETIUM TC 99M TETROFOSMIN IV KIT
10.0720 | PACK | Freq: Once | INTRAVENOUS | Status: AC | PRN
  Administered 2020-02-23: 10.072 via INTRAVENOUS

## 2020-02-23 MED ORDER — REGADENOSON 0.4 MG/5ML IV SOLN
0.4000 mg | Freq: Once | INTRAVENOUS | Status: AC
  Administered 2020-02-23: 0.4 mg via INTRAVENOUS

## 2020-02-24 ENCOUNTER — Telehealth: Payer: Self-pay

## 2020-02-24 NOTE — Telephone Encounter (Signed)
Called patient and gave him results of his Myoview. Patient requested to have his follow up visit as a telephone visit. I sent the telephone visit consent to his MyChart and will convert his appointment to a telehealth visit.

## 2020-02-26 ENCOUNTER — Telehealth: Payer: Self-pay | Admitting: Cardiology

## 2020-02-26 NOTE — Telephone Encounter (Signed)
°  Patient Consent for Virtual Visit         Shawn Medina has provided verbal consent on 02/26/2020 for a virtual visit (video or telephone).   CONSENT FOR VIRTUAL VISIT FOR:  Shawn Medina  By participating in this virtual visit I agree to the following:  I hereby voluntarily request, consent and authorize [facility] and its employed or contracted physicians, physician assistants, nurse practitioners or other licensed health care professionals (the Practitioner), to provide me with telemedicine health care services (the Services") as deemed necessary by the treating Practitioner. I acknowledge and consent to receive the Services by the Practitioner via telemedicine. I understand that the telemedicine visit will involve communicating with the Practitioner through live audiovisual communication technology and the disclosure of certain medical information by electronic transmission. I acknowledge that I have been given the opportunity to request an in-person assessment or other available alternative prior to the telemedicine visit and am voluntarily participating in the telemedicine visit.  I understand that I have the right to withhold or withdraw my consent to the use of telemedicine in the course of my care at any time, without affecting my right to future care or treatment, and that the Practitioner or I may terminate the telemedicine visit at any time. I understand that I have the right to inspect all information obtained and/or recorded in the course of the telemedicine visit and may receive copies of available information for a reasonable fee.  I understand that some of the potential risks of receiving the Services via telemedicine include:   Delay or interruption in medical evaluation due to technological equipment failure or disruption;  Information transmitted may not be sufficient (e.g. poor resolution of images) to allow for appropriate medical decision making by the  Practitioner; and/or   In rare instances, security protocols could fail, causing a breach of personal health information.  Furthermore, I acknowledge that it is my responsibility to provide information about my medical history, conditions and care that is complete and accurate to the best of my ability. I acknowledge that Practitioner's advice, recommendations, and/or decision may be based on factors not within their control, such as incomplete or inaccurate data provided by me or distortions of diagnostic images or specimens that may result from electronic transmissions. I understand that the practice of medicine is not an exact science and that Practitioner makes no warranties or guarantees regarding treatment outcomes. I acknowledge that a copy of this consent can be made available to me via my patient portal [facility] MyChart), or I can request a printed copy by calling the office of [facility].    I understand that my insurance will be billed for this visit.   I have read or had this consent read to me.  I understand the contents of this consent, which adequately explains the benefits and risks of the Services being provided via telemedicine.   I have been provided ample opportunity to ask questions regarding this consent and the Services and have had my questions answered to my satisfaction.  I give my informed consent for the services to be provided through the use of telemedicine in my medical care

## 2020-02-27 ENCOUNTER — Telehealth (INDEPENDENT_AMBULATORY_CARE_PROVIDER_SITE_OTHER): Payer: No Typology Code available for payment source | Admitting: Cardiology

## 2020-02-27 ENCOUNTER — Other Ambulatory Visit: Payer: Self-pay

## 2020-02-27 ENCOUNTER — Encounter: Payer: Self-pay | Admitting: Cardiology

## 2020-02-27 VITALS — Ht 68.0 in | Wt 204.0 lb

## 2020-02-27 DIAGNOSIS — R079 Chest pain, unspecified: Secondary | ICD-10-CM | POA: Diagnosis not present

## 2020-02-27 DIAGNOSIS — I10 Essential (primary) hypertension: Secondary | ICD-10-CM

## 2020-02-27 NOTE — Patient Instructions (Signed)
Medication Instructions:  NO Changes *If you need a refill on your cardiac medications before your next appointment, please call your pharmacy*   Lab Work: None Ordered If you have labs (blood work) drawn today and your tests are completely normal, you will receive your results only by: Marland Kitchen MyChart Message (if you have MyChart) OR . A paper copy in the mail If you have any lab test that is abnormal or we need to change your treatment, we will call you to review the results.   Testing/Procedures: None Ordered   Follow-Up: At [facility], you and your health needs are our priority.  As part of our continuing mission to provide you with exceptional heart care, we have created designated Provider Care Teams.  These Care Teams include your primary Cardiologist (physician) and Advanced Practice Providers (APPs -  Physician Assistants and Nurse Practitioners) who all work together to provide you with the care you need, when you need it.  We recommend signing up for the patient portal called "MyChart".  Sign up information is provided on this After Visit Summary.  MyChart is used to connect with patients for Virtual Visits (Telemedicine).  Patients are able to view lab/test results, encounter notes, upcoming appointments, etc.  Non-urgent messages can be sent to your provider as well.   To learn more about what you can do with MyChart, go to [email or web address].    Your next appointment:   Follow up as needed   The format for your next appointment:   In Person  Provider:   Debbe Odea, MD   Other Instructions

## 2020-02-27 NOTE — Progress Notes (Signed)
Virtual Visit via Telephone Note   This visit type was conducted due to national recommendations for restrictions regarding the COVID-19 Pandemic (e.g. social distancing) in an effort to limit this patient's exposure and mitigate transmission in our community.  Due to his co-morbid illnesses, this patient is at least at moderate risk for complications without adequate follow up.  This format is felt to be most appropriate for this patient at this time.  The patient did not have access to video technology/had technical difficulties with video requiring transitioning to audio format only (telephone).  All issues noted in this document were discussed and addressed.  No physical exam could be performed with this format.  Please refer to the patient's chart for his  consent to telehealth for [facility].   Date:  02/27/2020   ID:  Shawn Medina, DOB March 07, 1991, MRN [id number]  Patient Location: Home Provider Location: Office/Clinic  PCP:  System, Pcp Not In  Cardiologist:  Debbe Odea, MD  Electrophysiologist:  None   Evaluation Performed:  Follow-Up Visit  Chief Complaint:  Follow up for cardiac testing  History of Present Illness:    Shawn Medina is a 29 y.o. male with history of hypertension who is being seen for follow-up.  Patient previously seen due to chest discomfort occurring over 3 months.  Patient is in the [employer], takes propanolol for blood pressure.  Due to symptoms and obviously work status, echocardiogram and stress test was performed to evaluate cardiac function.  Visit today is to discuss results.  He still has occasional chest discomfort not related with exertion.  Last occurrence was couple of days ago.  Symptoms were not as severe as prior.  The patient does not have symptoms concerning for COVID-19 infection (fever, chills, cough, or new shortness of breath).    Past Medical History:  Diagnosis Date  . Hypertension    History reviewed. No  pertinent surgical history.   Current Meds  Medication Sig  . cholecalciferol (VITAMIN D3) 25 MCG (1000 UNIT) tablet Take 1,000 Units by mouth daily.  Marland Kitchen ibuprofen (ADVIL) 400 MG tablet Take 400 mg by mouth every 6 (six) hours as needed.  . propranolol (INDERAL) 60 MG tablet Take 60 mg by mouth daily.     Allergies:   Soy allergy   Social History   Tobacco Use  . Smoking status: Never Smoker  . Smokeless tobacco: Never Used  Vaping Use  . Vaping Use: Never used  Substance Use Topics  . Alcohol use: Never  . Drug use: No     Family Hx: The patient's family history is not on file.  ROS:   Please see the history of present illness.     All other systems reviewed and are negative.   Prior CV studies:   The following studies were reviewed today:  Echo and stress test  Labs/Other Tests and Data Reviewed:    EKG:  No ECG reviewed.  Recent Labs: 10/26/2019: BUN 11; Creatinine, Ser 0.94; Hemoglobin 14.9; Platelets 141; Potassium 3.2; Sodium 138   Recent Lipid Panel No results found for: CHOL, TRIG, HDL, CHOLHDL, LDLCALC, LDLDIRECT  Wt Readings from Last 3 Encounters:  02/27/20 204 lb (92.5 kg)  01/19/20 204 lb 2 oz (92.6 kg)  10/26/19 210 lb (95.3 kg)     Objective:    Vital Signs:  Ht 5\' 8"  (1.727 m)   Wt 204 lb (92.5 kg)   BMI 31.02 kg/m    VITAL SIGNS:  reviewed  ASSESSMENT & PLAN:    1. Patient with history of chest pain, symptoms appear atypical, history of hypertension.  Echocardiogram showed normal systolic and diastolic function, EF 55 to 60%.  Lexiscan Myoview did not show any evidence for ischemia, low risk study.  Patient reassured.  If symptoms persist or become worse he will let us know and we may consider additional testing such as coronary CTA.  But from above testing, I highly doubt patient has cardiac disease.  He is young apart from having hypertension he is otherwise healthy. 2. History of hypertension, BP normal today at home typically in the  120s.  Continue beta-blocker as prescribed.  COVID-19 Education: The signs and symptoms of COVID-19 were discussed with the patient and how to seek care for testing (follow up with PCP or arrange E-visit).  The importance of social distancing was discussed today.  Time:   Today, I have spent 40 minutes with the patient with telehealth technology discussing the above problems, including explaining testing results.   Medication Adjustments/Labs and Tests Ordered: Current medicines are reviewed at length with the patient today.  Concerns regarding medicines are outlined above.   Tests Ordered: No orders of the defined types were placed in this encounter.   Medication Changes: No orders of the defined types were placed in this encounter.   Follow Up:  In Person prn  Signed, Debbe Odea, MD  02/27/2020 3:04 PM    [facility]

## 2020-04-28 ENCOUNTER — Other Ambulatory Visit: Payer: Self-pay | Admitting: Internal Medicine

## 2020-04-28 ENCOUNTER — Other Ambulatory Visit (HOSPITAL_COMMUNITY): Payer: Self-pay | Admitting: Internal Medicine

## 2020-04-28 DIAGNOSIS — M545 Low back pain, unspecified: Secondary | ICD-10-CM

## 2020-05-07 ENCOUNTER — Ambulatory Visit: Payer: No Typology Code available for payment source

## 2020-05-18 ENCOUNTER — Ambulatory Visit
Admission: RE | Admit: 2020-05-18 | Discharge: 2020-05-18 | Disposition: A | Discharge status: Home / Self Care | Payer: No Typology Code available for payment source | Source: Ambulatory Visit | Attending: Internal Medicine | Admitting: Internal Medicine

## 2020-05-18 ENCOUNTER — Other Ambulatory Visit: Payer: Self-pay

## 2020-05-18 DIAGNOSIS — M545 Low back pain, unspecified: Secondary | ICD-10-CM | POA: Diagnosis present

## 2020-05-18 MED ORDER — GADOBUTROL 1 MMOL/ML IV SOLN
9.0000 mL | Freq: Once | INTRAVENOUS | Status: AC | PRN
  Administered 2020-05-18: 9 mL via INTRAVENOUS

## 2021-05-24 ENCOUNTER — Ambulatory Visit
Payer: No Typology Code available for payment source | Attending: Student in an Organized Health Care Education/Training Program | Admitting: Student in an Organized Health Care Education/Training Program

## 2021-05-24 ENCOUNTER — Ambulatory Visit
Admission: RE | Admit: 2021-05-24 | Discharge: 2021-05-24 | Disposition: A | Discharge status: Home / Self Care | Payer: No Typology Code available for payment source | Attending: Student in an Organized Health Care Education/Training Program | Admitting: Student in an Organized Health Care Education/Training Program

## 2021-05-24 ENCOUNTER — Ambulatory Visit
Admission: RE | Admit: 2021-05-24 | Discharge: 2021-05-24 | Disposition: A | Discharge status: Home / Self Care | Payer: No Typology Code available for payment source | Source: Ambulatory Visit | Attending: Student in an Organized Health Care Education/Training Program | Admitting: Student in an Organized Health Care Education/Training Program

## 2021-05-24 ENCOUNTER — Other Ambulatory Visit: Payer: Self-pay

## 2021-05-24 ENCOUNTER — Encounter: Payer: Self-pay | Admitting: Student in an Organized Health Care Education/Training Program

## 2021-05-24 VITALS — BP 143/87 | HR 82 | Temp 97.0°F | Resp 15 | Ht 68.0 in | Wt 215.4 lb

## 2021-05-24 DIAGNOSIS — M4316 Spondylolisthesis, lumbar region: Secondary | ICD-10-CM | POA: Insufficient documentation

## 2021-05-24 DIAGNOSIS — M25561 Pain in right knee: Secondary | ICD-10-CM | POA: Diagnosis not present

## 2021-05-24 DIAGNOSIS — M48061 Spinal stenosis, lumbar region without neurogenic claudication: Secondary | ICD-10-CM

## 2021-05-24 DIAGNOSIS — M5416 Radiculopathy, lumbar region: Secondary | ICD-10-CM

## 2021-05-24 DIAGNOSIS — M25562 Pain in left knee: Secondary | ICD-10-CM | POA: Insufficient documentation

## 2021-05-24 DIAGNOSIS — G8929 Other chronic pain: Secondary | ICD-10-CM | POA: Diagnosis present

## 2021-05-24 DIAGNOSIS — M79671 Pain in right foot: Secondary | ICD-10-CM

## 2021-05-24 DIAGNOSIS — M4306 Spondylolysis, lumbar region: Secondary | ICD-10-CM | POA: Diagnosis not present

## 2021-05-24 NOTE — Progress Notes (Signed)
Patient: Shawn Medina  Service Category: E/M  Provider: [name], MD  DOB: [birth year]  DOS: 05/24/2021  Referring Provider: No ref. provider found  MRN: [id number]  Setting: Ambulatory outpatient  PCP: [place]  Type: New Patient  Specialty: Interventional Pain Management    Location: Office  Delivery: Face-to-face     Primary Reason(s) for Visit: Encounter for initial evaluation of one or more chronic problems (new to examiner) potentially causing chronic pain, and posing a threat to normal musculoskeletal function. (Level of risk: High) CC: Back Pain (lower), Hip Pain (right), Knee Pain (right), Headache, and Foot Pain (Right - sharp/throb at heel)  HPI  Shawn Medina is a 30 y.o. year old, male patient, who comes for the first time to our practice referred by No ref. provider found for our initial evaluation of his chronic pain. 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 Today he comes in for evaluation of his Back Pain (lower), Hip Pain (right), Knee Pain (right), Headache, and Foot Pain (Right - sharp/throb at heel)  Pain Assessment: Location: Lower Back Radiating: through legs to include feet/toes bilat Onset: More than a month ago Duration: Chronic pain Quality: Aching, Throbbing, Sharp, Shooting, Squeezing, Stabbing Severity: 7 /10 (subjective, self-reported pain score)  Effect on ADL: "limits my activities in every way" Timing: Constant Modifying factors: reclining BP: (!) 143/87  HR: 82  Onset and Duration: Gradual and Present longer than 3 months Cause of pain: Work related accident or event Severity: Getting worse, NAS-11 at its worse: 9/10, NAS-11 at its best: 4/10, NAS-11 now: 6/10, and NAS-11 on the average: 6/10 Timing: Not influenced by the time of the day,  During activity or exercise, and After activity or exercise Aggravating Factors: Bending, Climbing, Intercourse (sex), Kneeling, Lifiting, Prolonged sitting, Prolonged standing, Squatting, Twisting, and Working Alleviating Factors: Cold packs, Hot packs, Lying down, Medications, Resting, Sitting, Sleeping, TENS, and Warm showers or baths Associated Problems: Depression, Fatigue, Inability to concentrate, Nausea, Numbness, Spasms, Tingling, Vomiting , Weakness, Pain that wakes patient up, and Pain that does not allow patient to sleep Quality of Pain: Aching, Agonizing, Annoying, Burning, Constant, Distressing, Exhausting, Getting longer, Heavy, Nagging, Pressure-like, Pulsating, Sharp, Shooting, Stabbing, Throbbing, Tingling, Uncomfortable, and Work related Previous Examinations or Tests: CT scan, MRI scan, and X-rays Previous Treatments: Stretching exercises and TENS  Shawn Medina is a pleasant 30 year old male who presents with a chief complaint of low back pain with radiation into bilateral legs in a dermatomal fashion.  He also endorses numbness and tingling in his legs at times.  This is associated with his pain flares.  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  He is being referred by the [place].  He also complains of bilateral knee pain.  He also endorses right heel pain that is worse with weightbearing.  He also deals with migraines.  States that his primary pain complaint today is his radiating low back pain.  Has MRI below shows bilateral chronic L5 pars defect resulting in anterolisthesis and causing severe left and moderate right neuroforaminal stenosis at L5.  He is being referred here for a epidural steroid injection.  He has done physical therapy in the past with limited response.  This was done for over 8 months at home.  He has tried gabapentin 300 mg twice daily which has  been escalated to 600 twice daily with limited response.  He has also tried Cymbalta, lidocaine patches, ibuprofen with limited response.  He was  started on bupropion for anxiety and depression.  This is managed at the [place].  We will focus on nonopioid management therapies, physical therapy and interventional therapy.  Meds   Current Outpatient Medications:    Acetaminophen 500 MG capsule, Take by mouth., Disp: , Rfl:    atorvastatin (LIPITOR) 80 MG tablet, Take 80 mg by mouth daily., Disp: , Rfl:    cholecalciferol (VITAMIN D3) 25 MCG (1000 UNIT) tablet, Take 1,000 Units by mouth daily., Disp: , Rfl:    gabapentin (NEURONTIN) 300 MG capsule, Take 300 mg by mouth 3 (three) times daily., Disp: , Rfl:    ibuprofen (ADVIL) 400 MG tablet, Take 400 mg by mouth every 6 (six) hours as needed., Disp: , Rfl:    propranolol (INDERAL) 60 MG tablet, Take 60 mg by mouth daily., Disp: , Rfl:    verapamil (CALAN-SR) 120 MG CR tablet, Take 120 mg by mouth at bedtime., Disp: , Rfl:   Imaging Review    MR Lumbar Spine W Wo Contrast  Narrative CLINICAL DATA:  Low back pain  EXAM: MRI LUMBAR SPINE WITHOUT AND WITH CONTRAST  TECHNIQUE: Multiplanar and multiecho pulse sequences of the lumbar spine were obtained without and with intravenous contrast.  CONTRAST:  34m GADAVIST GADOBUTROL 1 MMOL/ML IV SOLN  COMPARISON:  None.  FINDINGS: Segmentation:  Standard.  Alignment: Grade 1 anterolisthesis of L5 over S1 due to bilateral pars defect.  Vertebrae: No vertebral body fracture, evidence of discitis, or bone lesion.  Conus medullaris and cauda equina: Conus extends to the L1 level. Conus and cauda equina appear normal.  Paraspinal and other soft tissues: Negative.  Disc levels:  T12-L1 through L3-4: No spinal canal or neural foraminal stenosis.  L4-5: Facet degenerative changes, left greater right. No spinal canal or neural foraminal stenosis.  L5-S1: Disc uncovering and mild facet degenerative changes with in association the anterolisthesis results in moderate bilateral neural foraminal stenosis, left greater than right. No spinal  canal stenosis.  IMPRESSION: 1. Grade 1 anterolisthesis of L5 over S1 due to bilateral pars defect with moderate bilateral neural foraminal stenosis, left greater than right. 2. No spinal canal stenosis.   Electronically Signed By: KPedro EarlsM.D. On: 05/18/2020 18:21 Complexity Note: Imaging results reviewed. Results shared with Mr. JMartinique using Layman's terms.                         ROS  Cardiovascular: High blood pressure Pulmonary or Respiratory: Temporary stoppage of breathing during sleep Neurological: No reported neurological signs or symptoms such as seizures, abnormal skin sensations, urinary and/or fecal incontinence, being born with an abnormal open spine and/or a tethered spinal cord Psychological-Psychiatric: Anxiousness and Depressed Gastrointestinal: No reported gastrointestinal signs or symptoms such as vomiting or evacuating blood, reflux, heartburn, alternating episodes of diarrhea and constipation, inflamed or scarred liver, or pancreas or irrregular and/or infrequent bowel movements Genitourinary: No reported renal or genitourinary signs or symptoms such as difficulty voiding or producing urine, peeing blood, non-functioning kidney, kidney stones, difficulty emptying the bladder, difficulty controlling the flow of urine, or chronic kidney disease Hematological: No reported hematological signs or symptoms such as prolonged bleeding, low or poor functioning platelets, bruising or bleeding easily, hereditary bleeding problems, low energy levels due to low hemoglobin or being anemic Endocrine: No reported endocrine signs  or symptoms such as high or low blood sugar, rapid heart rate due to high thyroid levels, obesity or weight gain due to slow thyroid or thyroid disease Rheumatologic: No reported rheumatological signs and symptoms such as fatigue, joint pain, tenderness, swelling, redness, heat, stiffness, decreased range of motion, with or without  associated rash Musculoskeletal: Negative for myasthenia gravis, muscular dystrophy, multiple sclerosis or malignant hyperthermia Work History: Disabled  Allergies  Shawn Medina is allergic to soy allergy.  Laboratory Chemistry Profile   Renal Lab Results  Component Value Date   BUN 11 10/26/2019   CREATININE 0.94 10/26/2019   GFRAA >60 10/26/2019   GFRNONAA >60 10/26/2019     Electrolytes Lab Results  Component Value Date   NA 138 10/26/2019   K 3.2 (L) 10/26/2019   CL 102 10/26/2019   CALCIUM 8.5 (L) 10/26/2019     Hepatic No results found for: AST, ALT, ALBUMIN, ALKPHOS, AMYLASE, LIPASE, AMMONIA   ID No results found for: LYMEIGGIGMAB, HIV, SARSCOV2NAA, STAPHAUREUS, MRSAPCR, HCVAB, PREGTESTUR, RMSFIGG, QFVRPH1IGG, QFVRPH2IGG, LYMEIGGIGMAB   Bone No results found for: VD25OH, [id number], LM7615HI3, [id number], 25OHVITD1, 25OHVITD2, 25OHVITD3, TESTOFREE, TESTOSTERONE   Endocrine Lab Results  Component Value Date   GLUCOSE 105 (H) 10/26/2019     Neuropathy No results found for: VITAMINB12, FOLATE, HGBA1C, HIV   CNS No results found for: COLORCSF, APPEARCSF, RBCCOUNTCSF, WBCCSF, POLYSCSF, LYMPHSCSF, EOSCSF, PROTEINCSF, GLUCCSF, JCVIRUS, CSFOLI, IGGCSF, LABACHR, ACETBL, LABACHR, ACETBL   Inflammation (CRP: Acute  ESR: Chronic) No results found for: CRP, ESRSEDRATE, LATICACIDVEN   Rheumatology No results found for: RF, ANA, LABURIC, URICUR, LYMEIGGIGMAB, LYMEABIGMQN, HLAB27   Coagulation Lab Results  Component Value Date   PLT 141 (L) 10/26/2019     Cardiovascular Lab Results  Component Value Date   HGB 14.9 10/26/2019   HCT 41.7 10/26/2019     Screening No results found for: SARSCOV2NAA, COVIDSOURCE, STAPHAUREUS, MRSAPCR, HCVAB, HIV, PREGTESTUR   Cancer No results found for: CEA, CA125, LABCA2   Allergens No results found for: ALMOND, APPLE, ASPARAGUS, AVOCADO, BANANA, BARLEY, BASIL, BAYLEAF, GREENBEAN, LIMABEAN, WHITEBEAN, BEEFIGE, REDBEET, BLUEBERRY,  BROCCOLI, CABBAGE, MELON, CARROT, CASEIN, CASHEWNUT, CAULIFLOWER, CELERY     Note: Lab results reviewed.  [place]  Drug: Shawn Medina  reports no history of drug use. Alcohol:  reports no history of alcohol use. Tobacco:  reports that he has never smoked. He has never used smokeless tobacco. Medical:  has a past medical history of Hypertension. Family: family history is not on file.  History reviewed. No pertinent surgical history. 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 In no apparent acute distress Vitals:   05/24/21 0858  BP: (!) 143/87  Pulse: 82  Resp: 15  Temp: (!) 97 F (36.1 C)  TempSrc: Temporal  SpO2: 99%  Weight: 215 lb 6.4 oz (97.7 kg)  Height: 5' 8"  (1.727 m)   BMI Assessment: Estimated body mass index is 32.75 kg/m as calculated from the following:   Height  as of this encounter: 5' 8"  (1.727 m).   Weight as of this encounter: 215 lb 6.4 oz (97.7 kg).  BMI interpretation table: BMI level Category Range association with higher incidence of chronic pain  <18 kg/m2 Underweight   18.5-24.9 kg/m2 Ideal body weight   25-29.9 kg/m2 Overweight Increased incidence by 20%  30-34.9 kg/m2 Obese (Class I) Increased incidence by 68%  35-39.9 kg/m2 Severe obesity (Class II) Increased incidence by 136%  >40 kg/m2 Extreme obesity (Class III) Increased incidence by 254%   Patient's current BMI Ideal Body weight  Body mass index is 32.75 kg/m. Ideal body  weight: 68.4 kg (150 lb 12.7 oz) Adjusted ideal body weight: 80.1 kg (176 lb 10.2 oz)   BMI Readings from Last 4 Encounters:  05/24/21 32.75 kg/m  02/27/20 31.02 kg/m  01/19/20 31.04 kg/m  10/26/19 31.93 kg/m   Wt Readings from Last 4 Encounters:  05/24/21 215 lb 6.4 oz (97.7 kg)  02/27/20 204 lb (92.5 kg)  01/19/20 204 lb 2 oz (92.6 kg)  10/26/19 210 lb (95.3 kg)    Psych/Mental status: Alert, oriented x 3 (person, place, & time)       Eyes: PERLA Respiratory: No evidence of acute respiratory distress  Lumbar Spine Area Exam  Skin & Axial Inspection: No masses, redness, or swelling Alignment: Symmetrical Functional ROM: Pain restricted ROM       Stability: No instability detected Muscle Tone/Strength: Functionally intact. No obvious neuro-muscular anomalies detected. Sensory (Neurological): Dermatomal pain pattern  Gait & Posture Assessment  Ambulation: Unassisted Gait: Relatively normal for age and body habitus Posture: WNL  Lower Extremity Exam    Side: Right lower extremity  Side: Left lower extremity  Stability: No instability observed          Stability: No instability observed          Skin & Extremity Inspection: Skin color, temperature, and hair growth are WNL. No peripheral edema or cyanosis. No masses, redness, swelling, asymmetry, or associated skin lesions. No contractures.  Skin & Extremity Inspection: Skin color, temperature, and hair growth are WNL. No peripheral edema or cyanosis. No masses, redness, swelling, asymmetry, or associated skin lesions. No contractures.  Functional ROM: Pain restricted ROM         Limited SLR (straight leg raise)  Functional ROM: Pain restricted ROM         Limited SLR (straight leg raise)  Muscle Tone/Strength: Functionally intact. No obvious neuro-muscular anomalies detected.  Muscle Tone/Strength: Functionally intact. No obvious neuro-muscular anomalies detected.  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 Diagnoses of Anterolisthesis of lumbar spine, Pars defect of lumbar spine, Chronic pain of both knees, Right foot pain, and Lumbar radiculopathy were also pertinent to this visit.  Visit Diagnosis (New problems to examiner): 1. 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 Anterolisthesis of lumbar spine   3. Pars defect of lumbar spine   4. Chronic pain of both knees   5. Right foot pain   6. Lumbar radiculopathy  Plan of Care (Initial workup plan)   1. 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 Anterolisthesis of lumbar spine  3. Pars defect of lumbar spine  4. Chronic pain of both knees - DG Knee 1-2 Views Right; Future - DG Knee 1-2 Views Left; Future  5. Right foot pain - DG Foot Complete Right; Future - Ambulatory referral to Podiatry  6. Lumbar radiculopathy - Lumbar Epidural Injection; Future   Imaging Orders         DG Knee 1-2 Views Right         DG Knee 1-2 Views Left         DG Foot Complete Right     Referral Orders         Ambulatory referral to Podiatry     Procedure Orders         Lumbar Epidural Injection    Provider-requested follow-up: Return in about 2 weeks (around 06/07/2021) for L5/S1 ESI , without sedation.  Future Appointments  Date Time Provider [place]  06/06/2021  9:40 AM [name], MD [facility] None   I spent a total of 60 minutes reviewing chart data, face-to-face evaluation  with the patient, counseling and coordination of care as detailed above.  Note by: [name], MD Date: 05/24/2021; Time: 10:33 AM

## 2021-05-24 NOTE — Progress Notes (Signed)
Safety precautions to be maintained throughout the outpatient stay will include: orient to surroundings, keep bed in low position, maintain call bell within reach at all times, provide assistance with transfer out of bed and ambulation.  

## 2021-05-24 NOTE — Patient Instructions (Addendum)
You will have Xrays completed prior to next appt. With pain clinic.  You have been referred to podiatry.  GENERAL RISKS AND COMPLICATIONS  What are the risk, side effects and possible complications? Generally speaking, most procedures are safe.  However, with any procedure there are risks, side effects, and the possibility of complications.  The risks and complications are dependent upon the sites that are lesioned, or the type of nerve block to be performed.  The closer the procedure is to the spine, the more serious the risks are.  Great care is taken when placing the radio frequency needles, block needles or lesioning probes, but sometimes complications can occur. Infection: Any time there is an injection through the skin, there is a risk of infection.  This is why sterile conditions are used for these blocks.  There are four possible types of infection. Localized skin infection. Central Nervous System Infection-This can be in the form of Meningitis, which can be deadly. Epidural Infections-This can be in the form of an epidural abscess, which can cause pressure inside of the spine, causing compression of the spinal cord with subsequent paralysis. This would require an emergency surgery to decompress, and there are no guarantees that the patient would recover from the paralysis. Discitis-This is an infection of the intervertebral discs.  It occurs in about 1% of discography procedures.  It is difficult to treat and it may lead to surgery.        2. Pain: the needles have to go through skin and soft tissues, will cause soreness.       3. Damage to internal structures:  The nerves to be lesioned may be near blood vessels or    other nerves which can be potentially damaged.       4. Bleeding: Bleeding is more common if the patient is taking blood thinners such as  aspirin, Coumadin, Ticiid, Plavix, etc., or if he/she have some genetic predisposition  such as hemophilia. Bleeding into the spinal  canal can cause compression of the spinal  cord with subsequent paralysis.  This would require an emergency surgery to  decompress and there are no guarantees that the patient would recover from the  paralysis.       5. Pneumothorax:  Puncturing of a lung is a possibility, every time a needle is introduced in  the area of the chest or upper back.  Pneumothorax refers to free air around the  collapsed lung(s), inside of the thoracic cavity (chest cavity).  Another two possible  complications related to a similar event would include: Hemothorax and Chylothorax.   These are variations of the Pneumothorax, where instead of air around the collapsed  lung(s), you may have blood or chyle, respectively.       6. Spinal headaches: They may occur with any procedures in the area of the spine.       7. Persistent CSF (Cerebro-Spinal Fluid) leakage: This is a rare problem, but may occur  with prolonged intrathecal or epidural catheters either due to the formation of a fistulous  track or a dural tear.       8. Nerve damage: By working so close to the spinal cord, there is always a possibility of  nerve damage, which could be as serious as a permanent spinal cord injury with  paralysis.       9. Death:  Although rare, severe deadly allergic reactions known as "Anaphylactic  reaction" can occur to any of the medications used.  10. Worsening of the symptoms:  We can always make thing worse.  What are the chances of something like this happening? Chances of any of this occuring are extremely low.  By statistics, you have more of a chance of getting killed in a motor vehicle accident: while driving to the hospital than any of the above occurring .  Nevertheless, you should be aware that they are possibilities.  In general, it is similar to taking a shower.  Everybody knows that you can slip, hit your head and get killed.  Does that mean that you should not shower again?  Nevertheless always keep in mind that statistics  do not mean anything if you happen to be on the wrong side of them.  Even if a procedure has a 1 (one) in a 1,000,000 (million) chance of going wrong, it you happen to be that one..Also, keep in mind that by statistics, you have more of a chance of having something go wrong when taking medications.  Who should not have this procedure? If you are on a blood thinning medication (e.g. Coumadin, Plavix, see list of "Blood Thinners"), or if you have an active infection going on, you should not have the procedure.  If you are taking any blood thinners, please inform your physician.  How should I prepare for this procedure? Do not eat or drink anything at least six hours prior to the procedure. Bring a driver with you .  It cannot be a taxi. Come accompanied by an adult that can drive you back, and that is strong enough to help you if your legs get weak or numb from the local anesthetic. Take all of your medicines the morning of the procedure with just enough water to swallow them. If you have diabetes, make sure that you are scheduled to have your procedure done first thing in the morning, whenever possible. If you have diabetes, take only half of your insulin dose and notify our nurse that you have done so as soon as you arrive at the clinic. If you are diabetic, but only take blood sugar pills (oral hypoglycemic), then do not take them on the morning of your procedure.  You may take them after you have had the procedure. Do not take aspirin or any aspirin-containing medications, at least eleven (11) days prior to the procedure.  They may prolong bleeding. Wear loose fitting clothing that may be easy to take off and that you would not mind if it got stained with Betadine or blood. Do not wear any jewelry or perfume Remove any nail coloring.  It will interfere with some of our monitoring equipment.  NOTE: Remember that this is not meant to be interpreted as a complete list of all possible complications.   Unforeseen problems may occur.  BLOOD THINNERS The following drugs contain aspirin or other products, which can cause increased bleeding during surgery and should not be taken for 2 weeks prior to and 1 week after surgery.  If you should need take something for relief of minor pain, you may take acetaminophen which is found in Tylenol,m Datril, Anacin-3 and Panadol. It is not blood thinner. The products listed below are.  Do not take any of the products listed below in addition to any listed on your instruction sheet.  A.P.C or A.P.C with Codeine Codeine Phosphate Capsules #3 Ibuprofen Ridaura  ABC compound Congesprin Imuran rimadil  Advil Cope Indocin Robaxisal  Alka-Seltzer Effervescent Pain Reliever and Antacid Coricidin or Coricidin-D  Indomethacin  Rufen  Alka-Seltzer plus Cold Medicine Cosprin Ketoprofen S-A-C Tablets  Anacin Analgesic Tablets or Capsules Coumadin Korlgesic Salflex  Anacin Extra Strength Analgesic tablets or capsules CP-2 Tablets Lanoril Salicylate  Anaprox Cuprimine Capsules Levenox Salocol  Anexsia-D Dalteparin Magan Salsalate  Anodynos Darvon compound Magnesium Salicylate Sine-off  Ansaid Dasin Capsules Magsal Sodium Salicylate  Anturane Depen Capsules Marnal Soma  APF Arthritis pain formula Dewitt's Pills Measurin Stanback  Argesic Dia-Gesic Meclofenamic Sulfinpyrazone  Arthritis Bayer Timed Release Aspirin Diclofenac Meclomen Sulindac  Arthritis pain formula Anacin Dicumarol Medipren Supac  Analgesic (Safety coated) Arthralgen Diffunasal Mefanamic Suprofen  Arthritis Strength Bufferin Dihydrocodeine Mepro Compound Suprol  Arthropan liquid Dopirydamole Methcarbomol with Aspirin Synalgos  ASA tablets/Enseals Disalcid Micrainin Tagament  Ascriptin Doan's Midol Talwin  Ascriptin A/D Dolene Mobidin Tanderil  Ascriptin Extra Strength Dolobid Moblgesic Ticlid  Ascriptin with Codeine Doloprin or Doloprin with Codeine Momentum Tolectin  Asperbuf Duoprin Mono-gesic  Trendar  Aspergum Duradyne Motrin or Motrin IB Triminicin  Aspirin plain, buffered or enteric coated Durasal Myochrisine Trigesic  Aspirin Suppositories Easprin Nalfon Trillsate  Aspirin with Codeine Ecotrin Regular or Extra Strength Naprosyn Uracel  Atromid-S Efficin Naproxen Ursinus  Auranofin Capsules Elmiron Neocylate Vanquish  Axotal Emagrin Norgesic Verin  Azathioprine Empirin or Empirin with Codeine Normiflo Vitamin E  Azolid Emprazil Nuprin Voltaren  Bayer Aspirin plain, buffered or children's or timed BC Tablets or powders Encaprin Orgaran Warfarin Sodium  Buff-a-Comp Enoxaparin Orudis Zorpin  Buff-a-Comp with Codeine Equegesic Os-Cal-Gesic   Buffaprin Excedrin plain, buffered or Extra Strength Oxalid   Bufferin Arthritis Strength Feldene Oxphenbutazone   Bufferin plain or Extra Strength Feldene Capsules Oxycodone with Aspirin   Bufferin with Codeine Fenoprofen Fenoprofen Pabalate or Pabalate-SF   Buffets II Flogesic Panagesic   Buffinol plain or Extra Strength Florinal or Florinal with Codeine Panwarfarin   Buf-Tabs Flurbiprofen Penicillamine   Butalbital Compound Four-way cold tablets Penicillin   Butazolidin Fragmin Pepto-Bismol   Carbenicillin Geminisyn Percodan   Carna Arthritis Reliever Geopen Persantine   Carprofen Gold's salt Persistin   Chloramphenicol Goody's Phenylbutazone   Chloromycetin Haltrain Piroxlcam   Clmetidine heparin Plaquenil   Cllnoril Hyco-pap Ponstel   Clofibrate Hydroxy chloroquine Propoxyphen         Before stopping any of these medications, be sure to consult the physician who ordered them.  Some, such as Coumadin (Warfarin) are ordered to prevent or treat serious conditions such as "deep thrombosis", "pumonary embolisms", and other heart problems.  The amount of time that you may need off of the medication may also vary with the medication and the reason for which you were taking it.  If you are taking any of these medications, please make sure  you notify your pain physician before you undergo any procedures.         Epidural Steroid Injection Patient Information  Description: The epidural space surrounds the nerves as they exit the spinal cord.  In some patients, the nerves can be compressed and inflamed by a bulging disc or a tight spinal canal (spinal stenosis).  By injecting steroids into the epidural space, we can bring irritated nerves into direct contact with a potentially helpful medication.  These steroids act directly on the irritated nerves and can reduce swelling and inflammation which often leads to decreased pain.  Epidural steroids may be injected anywhere along the spine and from the neck to the low back depending upon the location of your pain.   After numbing the skin with local anesthetic (like Novocaine), a small needle  is passed into the epidural space slowly.  You may experience a sensation of pressure while this is being done.  The entire block usually last less than 10 minutes.  Conditions which may be treated by epidural steroids:  Low back and leg pain Neck and arm pain Spinal stenosis Post-laminectomy syndrome Herpes zoster (shingles) pain Pain from compression fractures  Preparation for the injection:  Do not eat any solid food or dairy products within 8 hours of your appointment.  You may drink clear liquids up to 3 hours before appointment.  Clear liquids include water, black coffee, juice or soda.  No milk or cream please. You may take your regular medication, including pain medications, with a sip of water before your appointment  Diabetics should hold regular insulin (if taken separately) and take 1/2 normal NPH dos the morning of the procedure.  Carry some sugar containing items with you to your appointment. A driver must accompany you and be prepared to drive you home after your procedure.  Bring all your current medications with your. An IV may be inserted and sedation may be given at the  discretion of the physician.   A blood pressure cuff, EKG and other monitors will often be applied during the procedure.  Some patients may need to have extra oxygen administered for a short period. You will be asked to provide medical information, including your allergies, prior to the procedure.  We must know immediately if you are taking blood thinners (like Coumadin/Warfarin)  Or if you are allergic to IV iodine contrast (dye). We must know if you could possible be pregnant.  Possible side-effects: Bleeding from needle site Infection (rare, may require surgery) Nerve injury (rare) Numbness & tingling (temporary) Difficulty urinating (rare, temporary) Spinal headache ( a headache worse with upright posture) Light -headedness (temporary) Pain at injection site (several days) Decreased blood pressure (temporary) Weakness in arm/leg (temporary) Pressure sensation in back/neck (temporary)  Call if you experience: Fever/chills associated with headache or increased back/neck pain. Headache worsened by an upright position. New onset weakness or numbness of an extremity below the injection site Hives or difficulty breathing (go to the emergency room) Inflammation or drainage at the infection site Severe back/neck pain Any new symptoms which are concerning to you  Please note:  Although the local anesthetic injected can often make your back or neck feel good for several hours after the injection, the pain will likely return.  It takes 3-7 days for steroids to work in the epidural space.  You may not notice any pain relief for at least that one week.  If effective, we will often do a series of three injections spaced 3-6 weeks apart to maximally decrease your pain.  After the initial series, we generally will wait several months before considering a repeat injection of the same type.  If you have any questions, please call [phone] [facility]

## 2021-05-31 ENCOUNTER — Ambulatory Visit (INDEPENDENT_AMBULATORY_CARE_PROVIDER_SITE_OTHER): Payer: BC Managed Care – PPO

## 2021-05-31 ENCOUNTER — Ambulatory Visit: Payer: BC Managed Care – PPO | Admitting: Podiatry

## 2021-05-31 ENCOUNTER — Other Ambulatory Visit: Payer: Self-pay

## 2021-05-31 ENCOUNTER — Encounter: Payer: Self-pay | Admitting: *Deleted

## 2021-05-31 DIAGNOSIS — G43909 Migraine, unspecified, not intractable, without status migrainosus: Secondary | ICD-10-CM | POA: Insufficient documentation

## 2021-05-31 DIAGNOSIS — M722 Plantar fascial fibromatosis: Secondary | ICD-10-CM

## 2021-05-31 DIAGNOSIS — Z7189 Other specified counseling: Secondary | ICD-10-CM | POA: Insufficient documentation

## 2021-05-31 DIAGNOSIS — M545 Low back pain, unspecified: Secondary | ICD-10-CM | POA: Insufficient documentation

## 2021-05-31 DIAGNOSIS — H9313 Tinnitus, bilateral: Secondary | ICD-10-CM | POA: Insufficient documentation

## 2021-05-31 DIAGNOSIS — H903 Sensorineural hearing loss, bilateral: Secondary | ICD-10-CM | POA: Insufficient documentation

## 2021-05-31 DIAGNOSIS — R079 Chest pain, unspecified: Secondary | ICD-10-CM | POA: Insufficient documentation

## 2021-05-31 DIAGNOSIS — G4733 Obstructive sleep apnea (adult) (pediatric): Secondary | ICD-10-CM | POA: Insufficient documentation

## 2021-05-31 MED ORDER — METHYLPREDNISOLONE 4 MG PO TBPK
ORAL_TABLET | ORAL | 0 refills | Status: DC
Start: 2021-05-31 — End: 2021-06-10

## 2021-05-31 MED ORDER — METHYLPREDNISOLONE 4 MG PO TBPK: ORAL_TABLET | ORAL | 0 refills | Status: DC

## 2021-05-31 MED ORDER — MELOXICAM 15 MG PO TABS: 15.0000 mg | ORAL_TABLET | Freq: Every day | ORAL | 1 refills | Status: DC

## 2021-05-31 MED ORDER — MELOXICAM 15 MG PO TABS: 15.0000 mg | ORAL_TABLET | Freq: Every day | ORAL | 0 refills | Status: DC

## 2021-05-31 MED ORDER — BETAMETHASONE SOD PHOS & ACET 6 (3-3) MG/ML IJ SUSP: 3.0000 mg | Freq: Once | INTRAMUSCULAR | Status: DC

## 2021-05-31 NOTE — Addendum Note (Signed)
Addended by: Francesca Oman on: 05/31/2021 10:31 AM   Modules accepted: Orders

## 2021-05-31 NOTE — Progress Notes (Signed)
   Subjective: 30 y.o. male presenting today for evaluation of right heel pain is been going on for about 1 month now.  Patient believes it may be due to his job walking back and forth.  He is not anything for treatment at the moment.  He presents for further treatment and evaluation   Past Medical History:  Diagnosis Date   Hypertension      Objective: Physical Exam General: The patient is alert and oriented x3 in no acute distress.  Dermatology: Skin is warm, dry and supple bilateral lower extremities. Negative for open lesions or macerations bilateral.   Vascular: Dorsalis Pedis and Posterior Tibial pulses palpable bilateral.  Capillary fill time is immediate to all digits.  Neurological: Epicritic and protective threshold intact bilateral.   Musculoskeletal: Tenderness to palpation to the plantar aspect of the right heel along the plantar fascia. All other joints range of motion within normal limits bilateral. Strength 5/5 in all groups bilateral.   Radiographic exam: Normal osseous mineralization. Joint spaces preserved. No fracture/dislocation/boney destruction. No other soft tissue abnormalities or radiopaque foreign bodies.   Assessment: 1. Plantar fasciitis right  Plan of Care:  1. Patient evaluated. Xrays reviewed.   2. Injection of 0.5cc Celestone soluspan injected into the right plantar fascia  3. Rx for Medrol Dose Pack placed 4. Rx for Meloxicam ordered for patient. 5. Plantar fascial band(s) dispensed 6. Instructed patient regarding therapies and modalities at home to alleviate symptoms.  7.  Prescription provided to take to the [facility] for custom molded orthotics  8.  Return to clinic in 4 weeks.    [employer]   Felecia Shelling, DPM [facility]  Dr. Felecia Shelling, DPM    [address] [place], [place] [address]                Office [phone]  Fax [phone]

## 2021-06-06 ENCOUNTER — Other Ambulatory Visit: Payer: Self-pay

## 2021-06-06 ENCOUNTER — Ambulatory Visit (HOSPITAL_BASED_OUTPATIENT_CLINIC_OR_DEPARTMENT_OTHER)
Payer: No Typology Code available for payment source | Admitting: Student in an Organized Health Care Education/Training Program

## 2021-06-06 ENCOUNTER — Ambulatory Visit
Admission: RE | Admit: 2021-06-06 | Discharge: 2021-06-06 | Disposition: A | Discharge status: Home / Self Care | Payer: No Typology Code available for payment source | Source: Ambulatory Visit | Attending: Student in an Organized Health Care Education/Training Program | Admitting: Student in an Organized Health Care Education/Training Program

## 2021-06-06 ENCOUNTER — Encounter: Payer: Self-pay | Admitting: Student in an Organized Health Care Education/Training Program

## 2021-06-06 VITALS — BP 145/82 | HR 73 | Temp 97.4°F | Resp 18 | Ht 68.0 in | Wt 220.0 lb

## 2021-06-06 DIAGNOSIS — M4316 Spondylolisthesis, lumbar region: Secondary | ICD-10-CM | POA: Diagnosis not present

## 2021-06-06 DIAGNOSIS — M4306 Spondylolysis, lumbar region: Secondary | ICD-10-CM | POA: Insufficient documentation

## 2021-06-06 DIAGNOSIS — M5416 Radiculopathy, lumbar region: Secondary | ICD-10-CM | POA: Insufficient documentation

## 2021-06-06 DIAGNOSIS — M48061 Spinal stenosis, lumbar region without neurogenic claudication: Secondary | ICD-10-CM

## 2021-06-06 MED ORDER — SODIUM CHLORIDE 0.9% FLUSH
2.0000 mL | Freq: Once | INTRAVENOUS | Status: AC
  Administered 2021-06-06: 2 mL

## 2021-06-06 MED ORDER — IOHEXOL 180 MG/ML  SOLN
10.0000 mL | Freq: Once | INTRAMUSCULAR | Status: AC
  Administered 2021-06-06: 10 mL via EPIDURAL
  Filled 2021-06-06: qty 10

## 2021-06-06 MED ORDER — DEXAMETHASONE SODIUM PHOSPHATE 10 MG/ML IJ SOLN
10.0000 mg | Freq: Once | INTRAMUSCULAR | Status: AC
  Administered 2021-06-06: 10 mg
  Filled 2021-06-06: qty 1

## 2021-06-06 MED ORDER — ROPIVACAINE HCL 2 MG/ML IJ SOLN
2.0000 mL | Freq: Once | INTRAMUSCULAR | Status: AC
  Administered 2021-06-06: 2 mL via EPIDURAL

## 2021-06-06 MED ORDER — LIDOCAINE HCL 2 % IJ SOLN
20.0000 mL | Freq: Once | INTRAMUSCULAR | Status: AC
  Administered 2021-06-06: 400 mg
  Filled 2021-06-06: qty 20

## 2021-06-06 NOTE — Progress Notes (Signed)
PROVIDER NOTE: Information contained herein reflects review and annotations entered in association with encounter. Interpretation of such information and data should be left to medically-trained personnel. Information provided to patient can be located elsewhere in the medical record under "Patient Instructions". Document created using STT-dictation technology, any transcriptional errors that may result from process are unintentional.    Patient: Shawn Medina  Service Category: Procedure Provider: Edward Jolly, MD DOB: [birth year] DOS: 06/06/2021 Location: [facility] MRN: [id number] Setting: Ambulatory - outpatient Referring Provider: No ref. provider found Type: Established Patient Specialty: Interventional Pain Management PCP: [facility]  Primary Reason for Visit: Interventional Pain Management Treatment. CC: Back Pain (lower)   Procedure:          Anesthesia, Analgesia, Anxiolysis:  Type: Therapeutic Inter-Laminar Epidural Steroid Injection  #1  Region: Lumbar Level: L5-S1 Level. Laterality: Left-Sided         Anesthesia: Local (1-2% Lidocaine)  Anxiolysis: None  Sedation: None  Guidance: Fluoroscopy           Position: Prone with head of the table was raised to facilitate breathing.   Indications: 1. 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 Anterolisthesis of lumbar spine   3. Pars defect of lumbar spine   4. Lumbar radiculopathy    Pain Score: Pre-procedure: 5 /10 Post-procedure: 5 /10    Pre-op H&P Assessment:  Shawn Medina is a 30 y.o. (year old), male patient, seen today for interventional treatment. He  has no past surgical history on file. Shawn Medina has a current medication list which includes the following prescription(s): acetaminophen, atorvastatin, cholecalciferol, cyclobenzaprine, diclofenac, escitalopram, gabapentin, ibuprofen, lidocaine, meloxicam, propranolol, verapamil, acetaminophen,  gabapentin, ibuprofen, and methylprednisolone, and the following Facility-Administered Medications: betamethasone acetate-betamethasone sodium phosphate. His primarily concern today is the Back Pain (lower)  Initial Vital Signs:  Pulse/HCG Rate: 73ECG Heart Rate: 74 Temp:  (!) 97.4 F (36.3 C) Resp: 15 BP: 124/85 SpO2: 100 %  BMI: Estimated body mass index is 33.45 kg/m as calculated from the following:   Height as of this encounter:  (1.727 m).   Weight as of this encounter: 220 lb (99.8 kg).  Risk Assessment: Allergies: Reviewed. 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  Allergy Precautions: None required Coagulopathies: Reviewed. None identified.  Blood-thinner therapy: None at this time Active Infection(s): Reviewed. None identified. Shawn Medina is afebrile  Site Confirmation: Shawn Medina was asked to confirm the procedure and laterality before marking the site Procedure checklist: Completed Consent: Before the procedure and under the influence of no sedative(s), amnesic(s), or anxiolytics, the patient was informed of the treatment options, risks and possible complications. To fulfill our ethical and legal obligations, as recommended by the American Medical Association's Code of Ethics, I have informed the patient of my clinical impression; the nature and purpose of the treatment or procedure; the risks, benefits, and possible complications of the intervention; the alternatives, including doing nothing; the risk(s) and benefit(s) of the alternative treatment(s) or procedure(s); and the risk(s) and benefit(s) of doing nothing. The patient was provided information about the general risks and possible complications associated with the procedure. These may include, but are not limited to: failure to achieve desired goals, infection, bleeding, organ or nerve damage, allergic reactions, paralysis, and death. In addition, the patient was informed of those risks and complications associated to  Spine-related procedures, such as failure to decrease pain; infection (i.e.: Meningitis, epidural or intraspinal abscess); bleeding (i.e.: epidural hematoma,  subarachnoid hemorrhage, or any other type of intraspinal or peri-dural bleeding); organ or nerve damage (i.e.: Any type of peripheral nerve, nerve root, or spinal cord injury) with subsequent damage to sensory, motor, and/or autonomic systems, resulting in permanent pain, numbness, and/or weakness of one or several areas of the body; allergic reactions; (i.e.: anaphylactic reaction); and/or death. Furthermore, the patient was informed of those risks and complications associated with the medications. These include, but are not limited to: allergic reactions (i.e.: anaphylactic or anaphylactoid reaction(s)); adrenal axis suppression; blood sugar elevation that in diabetics may result in ketoacidosis or comma; water retention that in patients with history of congestive heart failure may result in shortness of breath, pulmonary edema, and decompensation with resultant heart failure; weight gain; swelling or edema; medication-induced neural toxicity; particulate matter embolism and blood vessel occlusion with resultant organ, and/or nervous system infarction; and/or aseptic necrosis of one or more joints. Finally, the patient was informed that Medicine is not an exact science; therefore, there is also the possibility of unforeseen or unpredictable risks and/or possible complications that may result in a catastrophic outcome. The patient indicated having understood very clearly. We have given the patient no guarantees and we have made no promises. Enough time was given to the patient to ask questions, all of which were answered to the patient's satisfaction. Shawn Medina has indicated that he wanted to continue with the procedure. Attestation: I, the ordering provider, attest that I have discussed with the patient the benefits, risks, side-effects, alternatives,  likelihood of achieving goals, and potential problems during recovery for the procedure that I have provided informed consent. Date  Time: 06/06/2021  9:30 AM  Pre-Procedure Preparation:  Monitoring: As per clinic protocol. Respiration, ETCO2, SpO2, BP, heart rate and rhythm monitor placed and checked for adequate function Safety Precautions: Patient was assessed for positional comfort and pressure points before starting the procedure. Time-out: I initiated and conducted the "Time-out" before starting the procedure, as per protocol. The patient was asked to participate by confirming the accuracy of the "Time Out" information. Verification of the correct person, site, and procedure were performed and confirmed by me, the nursing staff, and the patient. "Time-out" conducted as per Joint Commission's Universal Protocol (UP.01.01.01). Time: 1014  Description of Procedure:          Target Area: The interlaminar space, initially targeting the lower laminar border of the superior vertebral body. Approach: Paramedial approach. Area Prepped: Entire Posterior Lumbar Region DuraPrep (Iodine Povacrylex [0.7% available iodine] and Isopropyl Alcohol, 74% w/w) Safety Precautions: Aspiration looking for blood return was conducted prior to all injections. At no point did we inject any substances, as a needle was being advanced. No attempts were made at seeking any paresthesias. Safe injection practices and needle disposal techniques used. Medications properly checked for expiration dates. SDV (single dose vial) medications used. Description of the Procedure: Protocol guidelines were followed. The procedure needle was introduced through the skin, ipsilateral to the reported pain, and advanced to the target area. Bone was contacted and the needle walked caudad, until the lamina was cleared. The epidural space was identified using "loss-of-resistance technique" with 2-3 ml of PF-NaCl (0.9% NSS), in a 5cc LOR glass  syringe.  Vitals:   06/06/21 0941 06/06/21 1014 06/06/21 1021  BP: 124/85 125/81 (!) 145/82  Pulse: 73    Resp: 15 16 18   Temp: (!) 97.4 F (36.3 C)    TempSrc: Temporal    SpO2: 100% 100% 99%  Weight: 220 lb (99.8 kg)  Height: 5\' 8"  (1.727 m)      Start Time: 1014 hrs. End Time: 1020 hrs.  Materials:  Needle(s) Type: Epidural needle Gauge: 22G Length: 3.5-in Medication(s): Please see orders for medications and dosing details.  6 cc solution made of 3cc of preservative-free saline, 2 cc of 0.2% ropivacaine, 1 cc of Decadron 10 mg/cc.  Imaging Guidance (Spinal):          Type of Imaging Technique: Fluoroscopy Guidance (Spinal) Indication(s): Assistance in needle guidance and placement for procedures requiring needle placement in or near specific anatomical locations not easily accessible without such assistance. Exposure Time: Please see nurses notes. Contrast: Before injecting any contrast, we confirmed that the patient did not have an allergy to iodine, shellfish, or radiological contrast. Once satisfactory needle placement was completed at the desired level, radiological contrast was injected. Contrast injected under live fluoroscopy. No contrast complications. See chart for type and volume of contrast used. Fluoroscopic Guidance: I was personally present during the use of fluoroscopy. "Tunnel Vision Technique" used to obtain the best possible view of the target area. Parallax error corrected before commencing the procedure. "Direction-depth-direction" technique used to introduce the needle under continuous pulsed fluoroscopy. Once target was reached, antero-posterior, oblique, and lateral fluoroscopic projection used confirm needle placement in all planes. Images permanently stored in EMR. Interpretation: I personally interpreted the imaging intraoperatively. Adequate needle placement confirmed in multiple planes. Appropriate spread of contrast into desired area was observed. No  evidence of afferent or efferent intravascular uptake. No intrathecal or subarachnoid spread observed. Permanent images saved into the patient's record.  Post-operative Assessment:  Post-procedure Vital Signs:  Pulse/HCG Rate: 7382 Temp:  (!) 97.4 F (36.3 C) Resp: 18 BP: (!) 145/82 SpO2: 99 %  EBL: None  Complications: No immediate post-treatment complications observed by team, or reported by patient.  Note: The patient tolerated the entire procedure well. A repeat set of vitals were taken after the procedure and the patient was kept under observation following institutional policy, for this type of procedure. Post-procedural neurological assessment was performed, showing return to baseline, prior to discharge. The patient was provided with post-procedure discharge instructions, including a section on how to identify potential problems. Should any problems arise concerning this procedure, the patient was given instructions to immediately contact , at any time, without hesitation. In any case, we plan to contact the patient by telephone for a follow-up status report regarding this interventional procedure.  Comments:  No additional relevant information.  Plan of Care  Orders:  Orders Placed This Encounter  Procedures   DG PAIN CLINIC C-ARM 1-60 MIN NO REPORT    Intraoperative interpretation by procedural physician at [facility].    Standing Status:   Standing    Number of Occurrences:   1    Order Specific Question:   Reason for exam:    Answer:   Assistance in needle guidance and placement for procedures requiring needle placement in or near specific anatomical locations not easily accessible without such assistance.    Medications ordered for procedure: Meds ordered this encounter  Medications   iohexol (OMNIPAQUE) 180 MG/ML injection 10 mL    Must be Myelogram-compatible. If not available, you may substitute with a water-soluble, non-ionic, hypoallergenic,  myelogram-compatible radiological contrast medium.   lidocaine (XYLOCAINE) 2 % (with pres) injection 400 mg   ropivacaine (PF) 2 mg/mL (0.2%) (NAROPIN) injection 2 mL   sodium chloride flush (NS) 0.9 % injection 2 mL   dexamethasone (DECADRON) injection 10 mg  Medications administered: We administered iohexol, lidocaine, ropivacaine (PF) 2 mg/mL (0.2%), sodium chloride flush, and dexamethasone.  See the medical record for exact dosing, route, and time of administration.  Follow-up plan:   Return in about 1 month (around 07/07/2021) for Post Procedure Evaluation, virtual.     Left L5/S1 ESI 06/06/21  Recent Visits Date Type Provider Dept  05/24/21 Office Visit Edward Jolly, MD [facility]  Showing recent visits within past 90 days and meeting all other requirements Today's Visits Date Type Provider Dept  06/06/21 Procedure visit Edward Jolly, MD [facility]  Showing today's visits and meeting all other requirements Future Appointments Date Type Provider Dept  07/06/21 Appointment Edward Jolly, MD [facility]  Showing future appointments within next 90 days and meeting all other requirements Disposition: Discharge home  Discharge (Date  Time): 06/06/2021; 1030 hrs.   Primary Care Physician: [facility] Location: [facility] Note by: Edward Jolly, MD Date: 06/06/2021; Time: 10:38 AM  Disclaimer:  Medicine is not an exact science. The only guarantee in medicine is that nothing is guaranteed. It is important to note that the decision to proceed with this intervention was based on the information collected from the patient. The Data and conclusions were drawn from the patient's questionnaire, the interview, and the physical examination. Because the information was provided in large part by the patient, it cannot be guaranteed that it has not been purposely or unconsciously manipulated. Every effort has been made to  obtain as much relevant data as possible for this evaluation. It is important to note that the conclusions that lead to this procedure are derived in large part from the available data. Always take into account that the treatment will also be dependent on availability of resources and existing treatment guidelines, considered by other Pain Management Practitioners as being common knowledge and practice, at the time of the intervention. For Medico-Legal purposes, it is also important to point out that variation in procedural techniques and pharmacological choices are the acceptable norm. The indications, contraindications, technique, and results of the above procedure should only be interpreted and judged by a Board-Certified Interventional Pain Specialist with extensive familiarity and expertise in the same exact procedure and technique.

## 2021-06-06 NOTE — Patient Instructions (Addendum)
Epidural Steroid Injection Patient Information  Description: The epidural space surrounds the nerves as they exit the spinal cord.  In some patients, the nerves can be compressed and inflamed by a bulging disc or a tight spinal canal (spinal stenosis).  By injecting steroids into the epidural space, we can bring irritated nerves into direct contact with a potentially helpful medication.  These steroids act directly on the irritated nerves and can reduce swelling and inflammation which often leads to decreased pain.  Epidural steroids may be injected anywhere along the spine and from the neck to the low back depending upon the location of your pain.   After numbing the skin with local anesthetic (like Novocaine), a small needle is passed into the epidural space slowly.  You may experience a sensation of pressure while this is being done.  The entire block usually last less than 10 minutes.  Conditions which may be treated by epidural steroids:  Low back and leg pain Neck and arm pain Spinal stenosis Post-laminectomy syndrome Herpes zoster (shingles) pain Pain from compression fractures  Preparation for the injection:  Do not eat any solid food or dairy products within 8 hours of your appointment.  You may drink clear liquids up to 3 hours before appointment.  Clear liquids include water, black coffee, juice or soda.  No milk or cream please. You may take your regular medication, including pain medications, with a sip of water before your appointment  Diabetics should hold regular insulin (if taken separately) and take 1/2 normal NPH dos the morning of the procedure.  Carry some sugar containing items with you to your appointment. A driver must accompany you and be prepared to drive you home after your procedure.  Bring all your current medications with your. An IV may be inserted and sedation may be given at the discretion of the physician.   A blood pressure cuff, EKG and other monitors will  often be applied during the procedure.  Some patients may need to have extra oxygen administered for a short period. You will be asked to provide medical information, including your allergies, prior to the procedure.  We must know immediately if you are taking blood thinners (like Coumadin/Warfarin)  Or if you are allergic to IV iodine contrast (dye). We must know if you could possible be pregnant.  Possible side-effects: Bleeding from needle site Infection (rare, may require surgery) Nerve injury (rare) Numbness & tingling (temporary) Difficulty urinating (rare, temporary) Spinal headache ( a headache worse with upright posture) Light -headedness (temporary) Pain at injection site (several days) Decreased blood pressure (temporary) Weakness in arm/leg (temporary) Pressure sensation in back/neck (temporary)  Call if you experience: Fever/chills associated with headache or increased back/neck pain. Headache worsened by an upright position. New onset weakness or numbness of an extremity below the injection site Hives or difficulty breathing (go to the emergency room) Inflammation or drainage at the infection site Severe back/neck pain Any new symptoms which are concerning to you  Please note:  Although the local anesthetic injected can often make your back or neck feel good for several hours after the injection, the pain will likely return.  It takes 3-7 days for steroids to work in the epidural space.  You may not notice any pain relief for at least that one week.  If effective, we will often do a series of three injections spaced 3-6 weeks apart to maximally decrease your pain.  After the initial series, we generally will wait several months before   considering a repeat injection of the same type.  If you have any questions, please call [phone] [facility]  General Discharge Instructions :  If you need to  reach your doctor call: Monday-Friday 8:00 am - 4:00 pm at [phone] or toll free [phone].  After clinic hours [phone] to have operator reach doctor.  Bring all of your medication bottles to all your appointments in the pain clinic.  To cancel or reschedule your appointment with Pain Management please remember to call 24 hours in advance to avoid a fee.  Refer to the educational materials which you have been given on: General Risks, I had my Procedure. Discharge Instructions, Post Sedation.  Post Procedure Instructions:  The drugs you were given will stay in your system until tomorrow, so for the next 24 hours you should not drive, make any legal decisions or drink any alcoholic beverages.  You may eat anything you prefer, but it is better to start with liquids then soups and crackers, and gradually work up to solid foods.  Please notify your doctor immediately if you have any unusual bleeding, trouble breathing or pain that is not related to your normal pain.  Depending on the type of procedure that was done, some parts of your body may feel week and/or numb.  This usually clears up by tonight or the next day.  Walk with the use of an assistive device or accompanied by an adult for the 24 hours.  You may use ice on the affected area for the first 24 hours.  Put ice in a Ziploc bag and cover with a towel and place against area 15 minutes on 15 minutes off.  You may switch to heat after 24 hours.Epidural Steroid Injection Patient Information  Description: The epidural space surrounds the nerves as they exit the spinal cord.  In some patients, the nerves can be compressed and inflamed by a bulging disc or a tight spinal canal (spinal stenosis).  By injecting steroids into the epidural space, we can bring irritated nerves into direct contact with a potentially helpful medication.  These steroids act directly on the irritated nerves and can reduce swelling and inflammation which  often leads to decreased pain.  Epidural steroids may be injected anywhere along the spine and from the neck to the low back depending upon the location of your pain.   After numbing the skin with local anesthetic (like Novocaine), a small needle is passed into the epidural space slowly.  You may experience a sensation of pressure while this is being done.  The entire block usually last less than 10 minutes.  Conditions which may be treated by epidural steroids:  Low back and leg pain Neck and arm pain Spinal stenosis Post-laminectomy syndrome Herpes zoster (shingles) pain Pain from compression fractures  Preparation for the injection:  Do not eat any solid food or dairy products within 8 hours of your appointment.  You may drink clear liquids up to 3 hours before appointment.  Clear liquids include water, black coffee, juice or soda.  No milk or cream please. You may take your regular medication, including pain medications, with a sip of water before your appointment  Diabetics should hold regular insulin (if taken separately) and take 1/2 normal NPH dos the morning of the procedure.  Carry some sugar containing items with you to your appointment. A driver must accompany you and be prepared to drive you home after your procedure.  Bring   all your current medications with your. An IV may be inserted and sedation may be given at the discretion of the physician.   A blood pressure cuff, EKG and other monitors will often be applied during the procedure.  Some patients may need to have extra oxygen administered for a short period. You will be asked to provide medical information, including your allergies, prior to the procedure.  We must know immediately if you are taking blood thinners (like Coumadin/Warfarin)  Or if you are allergic to IV iodine contrast (dye). We must know if you could possible be pregnant.  Possible side-effects: Bleeding from needle site Infection (rare, may require  surgery) Nerve injury (rare) Numbness & tingling (temporary) Difficulty urinating (rare, temporary) Spinal headache ( a headache worse with upright posture) Light -headedness (temporary) Pain at injection site (several days) Decreased blood pressure (temporary) Weakness in arm/leg (temporary) Pressure sensation in back/neck (temporary)  Call if you experience: Fever/chills associated with headache or increased back/neck pain. Headache worsened by an upright position. New onset weakness or numbness of an extremity below the injection site Hives or difficulty breathing (go to the emergency room) Inflammation or drainage at the infection site Severe back/neck pain Any new symptoms which are concerning to you  Please note:  Although the local anesthetic injected can often make your back or neck feel good for several hours after the injection, the pain will likely return.  It takes 3-7 days for steroids to work in the epidural space.  You may not notice any pain relief for at least that one week.  If effective, we will often do a series of three injections spaced 3-6 weeks apart to maximally decrease your pain.  After the initial series, we generally will wait several months before considering a repeat injection of the same type.  If you have any questions, please call [phone] [facility] 

## 2021-06-06 NOTE — Progress Notes (Signed)
Safety precautions to be maintained throughout the outpatient stay will include: orient to surroundings, keep bed in low position, maintain call bell within reach at all times, provide assistance with transfer out of bed and ambulation.  

## 2021-06-07 ENCOUNTER — Telehealth: Payer: Self-pay

## 2021-06-07 NOTE — Telephone Encounter (Signed)
Called PP. Denies any needs at this time. Instructed to call if needed. 

## 2021-06-09 ENCOUNTER — Encounter: Payer: Self-pay | Admitting: Emergency Medicine

## 2021-06-09 ENCOUNTER — Other Ambulatory Visit: Payer: Self-pay

## 2021-06-09 ENCOUNTER — Emergency Department
Admission: EM | Admit: 2021-06-09 | Discharge: 2021-06-10 | Disposition: A | Discharge status: Home / Self Care | Payer: No Typology Code available for payment source | Attending: Emergency Medicine | Admitting: Emergency Medicine

## 2021-06-09 ENCOUNTER — Emergency Department: Payer: No Typology Code available for payment source

## 2021-06-09 DIAGNOSIS — M79605 Pain in left leg: Secondary | ICD-10-CM | POA: Diagnosis not present

## 2021-06-09 DIAGNOSIS — R209 Unspecified disturbances of skin sensation: Secondary | ICD-10-CM | POA: Diagnosis not present

## 2021-06-09 DIAGNOSIS — I10 Essential (primary) hypertension: Secondary | ICD-10-CM | POA: Diagnosis not present

## 2021-06-09 DIAGNOSIS — M5417 Radiculopathy, lumbosacral region: Secondary | ICD-10-CM | POA: Diagnosis not present

## 2021-06-09 DIAGNOSIS — R0789 Other chest pain: Secondary | ICD-10-CM | POA: Diagnosis not present

## 2021-06-09 DIAGNOSIS — G8929 Other chronic pain: Secondary | ICD-10-CM | POA: Diagnosis not present

## 2021-06-09 DIAGNOSIS — Z79899 Other long term (current) drug therapy: Secondary | ICD-10-CM | POA: Diagnosis not present

## 2021-06-09 DIAGNOSIS — R0602 Shortness of breath: Secondary | ICD-10-CM | POA: Insufficient documentation

## 2021-06-09 DIAGNOSIS — M545 Low back pain, unspecified: Secondary | ICD-10-CM | POA: Diagnosis present

## 2021-06-09 LAB — BASIC METABOLIC PANEL
Anion gap: 8 (ref 5–15)
BUN: 23 mg/dL — ABNORMAL HIGH (ref 6–20)
CO2: 28 mmol/L (ref 22–32)
Calcium: 9.1 mg/dL (ref 8.9–10.3)
Chloride: 101 mmol/L (ref 98–111)
Creatinine, Ser: 0.92 mg/dL (ref 0.61–1.24)
GFR, Estimated: >60 mL/min (ref >60)
Glucose, Bld: 111 mg/dL — ABNORMAL HIGH (ref 70–99)
Potassium: 3.6 mmol/L (ref 3.5–5.1)
Sodium: 137 mmol/L (ref 135–145)

## 2021-06-09 LAB — CBC
HCT: 43.3 % (ref 39.0–52.0)
Hemoglobin: 15.6 g/dL (ref 13.0–17.0)
MCH: 32 pg (ref 26.0–34.0)
MCHC: 36 g/dL (ref 30.0–36.0)
MCV: 88.7 fL (ref 80.0–100.0)
Platelets: 189 10*3/uL (ref 150–400)
RBC: 4.88 MIL/uL (ref 4.22–5.81)
RDW: 12 % (ref 11.5–15.5)
WBC: 13.6 10*3/uL — ABNORMAL HIGH (ref 4.0–10.5)
nRBC: 0 % (ref 0.0–0.2)

## 2021-06-09 LAB — TROPONIN I (HIGH SENSITIVITY): Troponin I (High Sensitivity): <2 ng/L (ref <18)

## 2021-06-09 NOTE — ED Triage Notes (Signed)
Pt to ED via POV, states had epidural shot in his lower back on Monday, states today started having substernal CP and SOB, pt c/o worsening lower back pain since having the epidural shot in his lower back. Pt denies radiation, states pain is "like a weight" in his chest.

## 2021-06-09 NOTE — ED Provider Notes (Signed)
[facility] Emergency Department Provider Note  ____________________________________________   Event Date/Time   First MD Initiated Contact with Patient 06/09/21 2343     (approximate)  I have reviewed the triage vital signs and the nursing notes.   HISTORY  Chief Complaint Back Pain, Shortness of Breath, and Chest Pain    HPI Shawn Medina is a 30 y.o. male with history of hypertension, chronic back pain who presents to the emergency department with complaints of back pain that started after he had an Epidural injection at L5-S1 06/06/21 by Dr. Edward Jolly.  He describes it as a tightness throughout his lower back.  He states he is having pain that radiates down his left leg as well as tingling.  No numbness, weakness, bowel or bladder incontinence, urinary retention.  No history of HIV, diabetes, cancer, IV drug abuse.  No previous back surgeries.  No new injury that he can recall.  Pain is worse with movement.  Has been taking Tylenol, ibuprofen and gabapentin without relief.  Also states today that he started having chest pressure and shortness of breath.  This is improving.  Denies fevers, chills, cough, nausea, vomiting, diarrhea, diaphoresis or dizziness.  No history of PE, DVT, exogenous estrogen use, recent fractures, surgery, trauma, hospitalization, prolonged travel or other immobilization. No lower extremity swelling or pain. No calf tenderness.  Has had previous stress test and echocardiogram that were reassuring in 2021.    NM stress test 02/23/20  There was no ST segment deviation noted during stress. The study is normal. This is a low risk study. The left ventricular ejection fraction is normal (55-65%). No evidence of coronary or aortic calcifications on CT images.    Echo 02/17/20    1. Left ventricular ejection fraction, by estimation, is 55 to 60%. The  left ventricle has normal function. Left ventricular endocardial border  not  optimally defined to evaluate regional wall motion. The left  ventricular internal cavity size was  borderline dilated. Left ventricular diastolic parameters were normal.   2. Right ventricular systolic function is normal. The right ventricular  size is normal.   3. The mitral valve is normal in structure. No evidence of mitral valve  regurgitation. No evidence of mitral stenosis.   4. The aortic valve is tricuspid. Aortic valve regurgitation is not  visualized. No aortic stenosis is present.   5. The inferior vena cava is normal in size with greater than 50%  respiratory variability, suggesting right atrial pressure of 3 mmHg.   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 No pertinent surgical history.  Prior to Admission medications   Medication Sig Start Date End Date Taking? Authorizing Provider  ibuprofen (ADVIL) 800 MG tablet Take 1 tablet (800  mg total) by mouth every 8 (eight) hours as needed for mild pain. 06/10/21  Yes Kerrianne Jeng N, DO  ondansetron (ZOFRAN-ODT) 4 MG disintegrating tablet Take 1 tablet (4 mg total) by mouth every 6 (six) hours as needed for nausea or vomiting. 06/10/21  Yes Bettie Capistran, Layla Maw, DO   oxyCODONE-acetaminophen (PERCOCET) 5-325 MG tablet Take 2 tablets by mouth every 6 (six) hours as needed for severe pain. 06/10/21 06/10/22 Yes Rosalita Carey, Layla Maw, DO  predniSONE (STERAPRED UNI-PAK 21 TAB) 10 MG (21) TBPK tablet Take as directed 06/10/21  Yes Voris Tigert N, DO  atorvastatin (LIPITOR) 80 MG tablet Take 80 mg by mouth daily.    [provider]  cholecalciferol (VITAMIN D3) 25 MCG (1000 UNIT) tablet Take 1,000 Units by mouth daily.    [provider]  cyclobenzaprine (FLEXERIL) 10 MG tablet cyclobenzaprine 10 mg oral tablet Start Date: 06/17/19 Status: Ordered 06/17/19   [provider]  diclofenac (VOLTAREN) 75 MG EC tablet diclofenac sodium 75 mg oral delayed release tablet Start Date: 06/17/19 Status: Ordered 06/17/19   [provider]  escitalopram (LEXAPRO) 10 MG tablet Take 2 tablets by mouth daily. 04/12/21   [provider]  gabapentin (NEURONTIN) 300 MG capsule Take 300 mg by mouth 3 (three) times daily. Patient not taking: Reported on 06/06/2021    [provider]  gabapentin (NEURONTIN) 300 MG capsule TAKE TWO CAPSULES BY MOUTH EVERY 8 HOURS FOR NERVE PAIN  START WITH 2 CAPSULES(600MG ) AT NIGHT, THEN INCREASE TO 2 CAPSULES DURING THE DAY IF IT  DOES NOT MAKE YOU TOO TIRED. MAY STAY AT 1 CAPSULE DURING THE DAY IF 2 CAPSULES MAKE YOU TOO SLEEPY. FOR NERVE PAIN  START WITH 2 CAPSULES(600MG ) AT NIGHT, THEN INCREASE TO 2 CAPSULES DURING THE DAY IF IT  DOES NOT MAKE YOU TOO TIRED. MAY STAY AT 1 CAPSULE DURING THE DAY IF 2 CAPSULES MAKE YOU TOO SLEEPY. 03/03/21   [provider]  lidocaine (XYLOCAINE) 5 % ointment APPLY LIBERAL AMOUNT TOPICALLY 3 TIMES A DAY AS NEEDED APPLY AS NEEDED TO PAINFUL JOINTS 07/29/20   [provider]  meloxicam (MOBIC) 15 MG tablet Take 1 tablet (15 mg total) by mouth daily. 05/31/21   Felecia Shelling, DPM  propranolol (INDERAL) 60 MG tablet Take 60 mg by mouth daily.    [provider]  verapamil (CALAN-SR) 120 MG CR tablet Take 120 mg by mouth at bedtime.    [provider]    Allergies Soy allergy  History reviewed. No pertinent family history.  Social History Social History   Tobacco Use   Smoking status: Never   Smokeless tobacco: Never  Vaping Use   Vaping Use: Never used  Substance Use Topics   Alcohol use: Never   Drug use: No    Review of Systems Constitutional: No fever. Eyes: No visual changes. ENT: No sore throat. Cardiovascular: + chest pain. Respiratory: + shortness of breath. Gastrointestinal: No nausea, vomiting, diarrhea. Genitourinary: Negative for dysuria. Musculoskeletal: + for back pain. Skin: Negative for rash. Neurological: Negative for focal weakness or numbness.  ____________________________________________   PHYSICAL EXAM:  VITAL SIGNS: ED Triage Vitals [06/09/21 2008]  Enc Vitals Group     BP (!) 153/89     Pulse Rate 80     Resp 18     Temp 98.5 F (36.9 C)     Temp Source Oral     SpO2 97 %     Weight  Height      Head Circumference      Peak Flow      Pain Score 6     Pain Loc      Pain Edu?      Excl. in GC?    CONSTITUTIONAL: Alert and oriented and responds appropriately to questions. Well-appearing; well-nourished HEAD: Normocephalic EYES: Conjunctivae clear, pupils appear equal, EOM appear intact ENT: normal nose; moist mucous membranes NECK: Supple, normal ROM CARD: RRR; S1 and S2 appreciated; no murmurs, no clicks, no rubs, no gallops RESP: Normal chest excursion without splinting or tachypnea; breath sounds clear and equal bilaterally; no wheezes, no rhonchi, no rales, no hypoxia or respiratory distress, speaking full sentences ABD/GI: Normal bowel sounds; non-distended; soft, non-tender, no rebound, no guarding, no peritoneal signs, no hepatosplenomegaly BACK: The back appears normal, patient is tender to palpation over the lower lumbar spine without step-off or  deformity.  There is no redness, warmth, soft tissue swelling, ecchymosis or other lesions noted. EXT: Normal ROM in all joints; no deformity noted, no edema; no cyanosis SKIN: Normal color for age and race; warm; no rash on exposed skin NEURO: Moves all extremities equally, strength 5/5 all 4 extremities, normal sensation diffusely, 2+ deep tendon reflexes in bilateral upper and lower extremities, no clonus, no saddle anesthesia PSYCH: The patient's mood and manner are appropriate.  ____________________________________________   LABS (all labs ordered are listed, but only abnormal results are displayed)  Labs Reviewed  BASIC METABOLIC PANEL - Abnormal; Notable for the following components:      Result Value   Glucose, Bld 111 (*)    BUN 23 (*)    All other components within normal limits  CBC - Abnormal; Notable for the following components:   WBC 13.6 (*)    All other components within normal limits  TROPONIN I (HIGH SENSITIVITY)  TROPONIN I (HIGH SENSITIVITY)   ____________________________________________  EKG   EKG Interpretation  Date/Time:  Thursday June 09 2021 20:11:09 EST Ventricular Rate:  77 PR Interval:  146 QRS Duration: 94 QT Interval:  358 QTC Calculation: 405 R Axis:   66 Text Interpretation: Normal sinus rhythm Possible Left atrial enlargement Borderline ECG Confirmed by Rochele Raring [phone]) on 06/10/2021 6:18:54 AM        ____________________________________________  RADIOLOGY Normajean Baxter Bryant Lipps, personally viewed and evaluated these images (plain radiographs) as part of my medical decision making, as well as reviewing the written report by the radiologist.  ED MD interpretation: Chest x-ray shows no acute abnormality.  MRI of the spine shows no epidural abscess or hematoma, osteomyelitis or discitis, cauda equina.  Official radiology report(s): DG Chest 2 View  Result Date: 06/09/2021 CLINICAL DATA:  Chest pain and shortness of breath. EXAM:  CHEST - 2 VIEW COMPARISON:  October 26, 2019 FINDINGS: The heart size and mediastinal contours are within normal limits. No focal consolidation. No pleural effusion. No pneumothorax. The visualized skeletal structures are unremarkable. IMPRESSION: No active cardiopulmonary disease. Electronically Signed   By: Maudry Mayhew M.D.   On: 06/09/2021 20:26   MR Lumbar Spine W Wo Contrast  Result Date: 06/10/2021 CLINICAL DATA:  Initial evaluation for acute low back pain, bilateral leg weakness for 1 week, cauda equina syndrome suspected. EXAM: MRI LUMBAR SPINE WITHOUT AND WITH CONTRAST TECHNIQUE: Multiplanar and multiecho pulse sequences of the lumbar spine were obtained without and with intravenous contrast. CONTRAST:  44mL GADAVIST GADOBUTROL 1 MMOL/ML IV SOLN COMPARISON:  MRI from 05/18/2020. FINDINGS: Segmentation: Standard. Lowest  well-formed disc space labeled the L5-S1 level. 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 Alignment otherwise normal with preservation of normal lumbar lordosis. Vertebrae: Vertebral body height maintained without acute or chronic fracture. Bone marrow signal intensity within normal limits. No discrete or worrisome osseous lesions. No abnormal marrow edema or enhancement. Conus medullaris and cauda equina: Conus extends to the L1 level. Conus and cauda equina appear normal. Paraspinal and other soft tissues: Unremarkable. Disc levels: T11-12: Small left paracentral disc protrusion mildly indents the left ventral thecal sac (series 8, image 3). No significant canal or lateral recess stenosis. Foramina remain patent. T12-L1: Unremarkable. L1-2:  Unremarkable. L2-3:  Unremarkable. L3-4: Negative interspace. Mild facet hypertrophy. No canal or foraminal stenosis. L4-5: Negative interspace. Moderate left with mild right facet hypertrophy. Resultant mild narrowing of the left lateral recess. Foramina remain patent. L5-S1:  Chronic bilateral pars defects with associated 5 mm spondylolisthesis. Associated broad posterior pseudo disc bulge/uncovering. Disc desiccation with intervertebral disc space narrowing. Mild reactive endplate spurring. Mild facet hypertrophy. No canal or lateral recess stenosis. Moderate bilateral L5 foraminal narrowing. IMPRESSION: 1. No acute abnormality within the lumbar spine. No evidence for cord compression. 2. 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 3. Moderate left greater than right facet hypertrophy at L4-5 with mild left lateral recess stenosis. 4. Small left paracentral disc protrusion at T11-12 without stenosis. Electronically Signed   By: Rise Mu M.D.   On: 06/10/2021 02:47    ____________________________________________   PROCEDURES  Procedure(s) performed (including Critical Care):  Procedures    ____________________________________________   INITIAL IMPRESSION / ASSESSMENT AND PLAN / ED COURSE  As part of my medical decision making, I reviewed the following data within the electronic MEDICAL RECORD NUMBER History obtained from family, Nursing notes reviewed and incorporated, Labs reviewed , EKG interpreted , Old EKG reviewed, Old chart reviewed, Radiograph reviewed , MRI reviewed, Notes from prior ED visits, and [place] Controlled Substance Database         Patient here with complaints of back pain after receiving an epidural injection.  Labs obtained in triage show that he does have a leukocytosis.  No known fevers.  He has no focal neurologic deficits on exam but given his recent injection we discussed that he has higher risk for epidural abscess, hematoma, discitis and osteomyelitis.  Doubt cauda equina based on his exam.  Will obtain MRI with and without contrast.  Will give pain medication.  Also complaining of chest pain.  Seems very atypical.  EKG obtained from triage  shows no ischemia, arrhythmia or interval abnormality.  Chest x-ray is clear.  Troponin negative.  Second pending.  He has had recent cardiac work-up in 2021 that was unremarkable.  Doubt ACS.  He is PERC negative.  Doubt dissection.  ED PROGRESS  Patient second troponin is negative.  Reports chest pain completely gone after pain medication but still having back pain.  Will give second dose of pain medications.  MRI shows degenerative changes but no acute neurosurgical abnormality specifically no cauda equina, epidural abscess or hematoma, discitis or osteomyelitis.    Patient reports feeling much better after pain medication.  He is comfortable with plan for discharge home.  Recommended close follow-up with his pain management specialist and will discharge with short course of pain medication as well as a steroid taper given his radicular symptoms.  Discussed return precautions  with patient and wife.  They are comfortable with this plan.  At this time, I do not feel there is any life-threatening condition present. I have reviewed, interpreted and discussed all results (EKG, imaging, lab, urine as appropriate) and exam findings with patient/family. I have reviewed nursing notes and appropriate previous records.  I feel the patient is safe to be discharged home without further emergent workup and can continue workup as an outpatient as needed. Discussed usual and customary return precautions. Patient/family verbalize understanding and are comfortable with this plan.  Outpatient follow-up has been provided as needed. All questions have been answered.  ____________________________________________   FINAL CLINICAL IMPRESSION(S) / ED DIAGNOSES  Final diagnoses:  Lumbosacral radiculopathy     ED Discharge Orders          Ordered    oxyCODONE-acetaminophen (PERCOCET) 5-325 MG tablet  Every 6 hours PRN        06/10/21 0442    ondansetron (ZOFRAN-ODT) 4 MG disintegrating tablet  Every 6 hours PRN         06/10/21 0442    ibuprofen (ADVIL) 800 MG tablet  Every 8 hours PRN        06/10/21 0442    predniSONE (STERAPRED UNI-PAK 21 TAB) 10 MG (21) TBPK tablet        06/10/21 0814            *Please note:  Shawn Medina was evaluated in Emergency Department on 06/10/2021 for the symptoms described in the history of present illness. He was evaluated in the context of the global COVID-19 pandemic, which necessitated consideration that the patient might be at risk for infection with the SARS-CoV-2 virus that causes COVID-19. Institutional protocols and algorithms that pertain to the evaluation of patients at risk for COVID-19 are in a state of rapid change based on information released by regulatory bodies including the CDC and federal and state organizations. These policies and algorithms were followed during the patient's care in the ED.  Some ED evaluations and interventions may be delayed as a result of limited staffing during and the pandemic.*   Note:  This document was prepared using Dragon voice recognition software and may include unintentional dictation errors.    Katora Fini, Layla Maw, DO 06/10/21 [phone]

## 2021-06-10 ENCOUNTER — Telehealth: Payer: Self-pay | Admitting: Student in an Organized Health Care Education/Training Program

## 2021-06-10 ENCOUNTER — Emergency Department: Payer: No Typology Code available for payment source

## 2021-06-10 LAB — TROPONIN I (HIGH SENSITIVITY): Troponin I (High Sensitivity): 3 ng/L (ref <18)

## 2021-06-10 MED ORDER — MORPHINE SULFATE (PF) 4 MG/ML IV SOLN
4.0000 mg | Freq: Once | INTRAVENOUS | Status: AC
  Administered 2021-06-10: 4 mg via INTRAVENOUS
  Filled 2021-06-10: qty 1

## 2021-06-10 MED ORDER — KETOROLAC TROMETHAMINE 30 MG/ML IJ SOLN
30.0000 mg | Freq: Once | INTRAMUSCULAR | Status: AC
  Administered 2021-06-10: 30 mg via INTRAVENOUS
  Filled 2021-06-10: qty 1

## 2021-06-10 MED ORDER — IBUPROFEN 800 MG PO TABS: 800.0000 mg | ORAL_TABLET | Freq: Three times a day (TID) | ORAL | 0 refills | Status: DC | PRN

## 2021-06-10 MED ORDER — HYDROMORPHONE HCL 1 MG/ML IJ SOLN
1.0000 mg | Freq: Once | INTRAMUSCULAR | Status: AC
  Administered 2021-06-10: 1 mg via INTRAVENOUS
  Filled 2021-06-10: qty 1

## 2021-06-10 MED ORDER — GADOBUTROL 1 MMOL/ML IV SOLN
10.0000 mL | Freq: Once | INTRAVENOUS | Status: AC | PRN
  Administered 2021-06-10: 10 mL via INTRAVENOUS

## 2021-06-10 MED ORDER — DEXAMETHASONE SODIUM PHOSPHATE 10 MG/ML IJ SOLN
10.0000 mg | Freq: Once | INTRAMUSCULAR | Status: AC
  Administered 2021-06-10: 10 mg via INTRAVENOUS
  Filled 2021-06-10: qty 1

## 2021-06-10 MED ORDER — ONDANSETRON HCL 4 MG/2ML IJ SOLN
4.0000 mg | Freq: Once | INTRAMUSCULAR | Status: AC
  Administered 2021-06-10: 4 mg via INTRAVENOUS
  Filled 2021-06-10: qty 2

## 2021-06-10 MED ORDER — OXYCODONE-ACETAMINOPHEN 5-325 MG PO TABS: 2.0000 | ORAL_TABLET | Freq: Four times a day (QID) | ORAL | 0 refills | Status: DC | PRN

## 2021-06-10 MED ORDER — ONDANSETRON 4 MG PO TBDP: 4.0000 mg | ORAL_TABLET | Freq: Four times a day (QID) | ORAL | 0 refills | Status: DC | PRN

## 2021-06-10 MED ORDER — PREDNISONE 10 MG (21) PO TBPK: ORAL_TABLET | ORAL | 0 refills | Status: DC

## 2021-06-10 NOTE — Telephone Encounter (Signed)
Spoke with Dr Cherylann Ratel and informed him of patient complaint.  Informed Dr Cherylann Ratel that patient had went to ED yesterday for chest pain and back pain.  MRI and cardiac workup done in ED and MRI was negative for hematoma.  Per Dr Cherylann Ratel to schedule for a virtual viosit on Monday or Tuesday.  Attempted to call patient to discuss.  LM to call office.

## 2021-06-10 NOTE — ED Notes (Signed)
Patient to MRI at this time.

## 2021-06-10 NOTE — Discharge Instructions (Addendum)
You are being provided a prescription for opiates (also known as narcotics) for pain control.  Opiates can be addictive and should only be used when absolutely necessary for pain control when other alternatives do not work.  We recommend you only use them for the recommended amount of time and only as prescribed.  Please do not take with other sedative medications or alcohol.  Please do not drive, operate machinery, make important decisions while taking opiates.  Please note that these medications can be addictive and have high abuse potential.  Patients can become addicted to narcotics after only taking them for a few days.  Please keep these medications locked away from children, teenagers or any family members with history of substance abuse.  Narcotic pain medicine may also make you constipated.  You may use over-the-counter medications such as MiraLAX, Colace to prevent constipation.  If you become constipated you may use over-the-counter enemas as needed.  Itching and nausea are common side effects of narcotic pain medication.  If you develop uncontrolled vomiting or a rash, please stop these medications.   MRI Impression:  1. No acute abnormality within the lumbar spine. No evidence for  cord compression.  2. Chronic bilateral pars defects at L5 with associated 5 mm  spondylolisthesis, with resultant moderate bilateral L5 foraminal  stenosis.  3. Moderate left greater than right facet hypertrophy at L4-5 with  mild left lateral recess stenosis.  4. Small left paracentral disc protrusion at T11-12 without  stenosis.

## 2021-06-10 NOTE — ED Notes (Signed)
Rounded on patient Patient awakens to verbal stimuli, rates pain as 4/10 Patient has no further needs at this time, is AxOx4 Will continue to monitor

## 2021-06-10 NOTE — Telephone Encounter (Signed)
Per Dr Cherylann Ratel, put the patient on for a virtual visit Monday or Tuesday.

## 2021-06-13 ENCOUNTER — Other Ambulatory Visit: Payer: Self-pay

## 2021-06-13 ENCOUNTER — Encounter: Payer: Self-pay | Admitting: Student in an Organized Health Care Education/Training Program

## 2021-06-13 ENCOUNTER — Ambulatory Visit
Payer: No Typology Code available for payment source | Attending: Student in an Organized Health Care Education/Training Program | Admitting: Student in an Organized Health Care Education/Training Program

## 2021-06-13 DIAGNOSIS — M5416 Radiculopathy, lumbar region: Secondary | ICD-10-CM

## 2021-06-13 DIAGNOSIS — G8929 Other chronic pain: Secondary | ICD-10-CM

## 2021-06-13 DIAGNOSIS — M4316 Spondylolisthesis, lumbar region: Secondary | ICD-10-CM | POA: Diagnosis not present

## 2021-06-13 DIAGNOSIS — M48061 Spinal stenosis, lumbar region without neurogenic claudication: Secondary | ICD-10-CM

## 2021-06-13 DIAGNOSIS — M25561 Pain in right knee: Secondary | ICD-10-CM

## 2021-06-13 DIAGNOSIS — M25562 Pain in left knee: Secondary | ICD-10-CM

## 2021-06-13 DIAGNOSIS — M4306 Spondylolysis, lumbar region: Secondary | ICD-10-CM | POA: Diagnosis not present

## 2021-06-13 NOTE — Progress Notes (Signed)
Patient: Shawn Medina  Service Category: E/M  Provider: [name], MD  DOB: Apr 03, 1991  DOS: 06/13/2021  Location: Office  MRN: [id number]  Setting: Ambulatory outpatient  Referring Provider: [facility] [place]  Type: Established Patient  Specialty: Interventional Pain Management  PCP: [facility] [place]  Location: Remote location  Delivery: TeleHealth     Virtual Encounter - Pain Management PROVIDER NOTE: Information contained herein reflects review and annotations entered in association with encounter. Interpretation of such information and data should be left to medically-trained personnel. Information provided to patient can be located elsewhere in the medical record under "Patient Instructions". Document created using STT-dictation technology, any transcriptional errors that may result from process are unintentional.    Contact & Pharmacy Preferred: [phone] Home: [phone] (home) Mobile: [phone] (mobile) E-mail: [email or web address]  [id number] [place] [address] [place]Phone: [phone]: [phone]  Pre-screening  Mr. JMartiniqueoffered "in-person" vs "virtual" encounter. He indicated preferring virtual for this encounter.   Reason COVID-19*  Social distancing based on CDC and AMA recommendations.   I contacted Cirilo Matthew JMartiniqueon 06/13/2021 via telephone.      I clearly identified myself as BGillis Santa MD. I verified that I was speaking with the correct person using two identifiers (Name: [name] and date of birth: [birth year].  Consent I sought verbal advanced consent from Jacorian Matthew JMartiniquefor virtual visit interactions. I informed Mr. JMartiniqueof possible security and privacy concerns, risks, and limitations associated with providing "not-in-person" medical evaluation and management services. I also informed Mr. JMartiniqueof the availability of "in-person" appointments.  Finally, I informed him that there would be a charge for the virtual visit and that he could be  personally, fully or partially, financially responsible for it. Mr. JMartiniqueexpressed understanding and agreed to proceed.   Historic Elements   Mr. Breccan Matthew JMartiniqueis a 30y.o. year old, male patient evaluated today after our last contact on 06/10/2021. Mr. JMartinique has a past medical history of Hypertension. He also  has no past surgical history on file. Mr. JMartiniquehas a current medication list which includes the following prescription(s): atorvastatin, cholecalciferol, cyclobenzaprine, diclofenac, escitalopram, gabapentin, gabapentin, ibuprofen, lidocaine, meloxicam, ondansetron, oxycodone-acetaminophen, prednisone, propranolol, and verapamil, and the following Facility-Administered Medications: betamethasone acetate-betamethasone sodium phosphate. He  reports that he has never smoked. He has never used smokeless tobacco. He reports that he does not drink alcohol and does not use drugs. 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   HPI  Today, he is being contacted for a post-procedure assessment.  Patient is status post left L5-S1 epidural steroid injection on 06/06/2021.  Unfortunately, he had increased lower back and radiating leg pain after the injection and went to the emergency department on 06/09/2021.  In the ED he complained of tightness in his lower back as well as pain that was rating down his left leg as well as tingling.  Emergent lumbar MRI was done to rule out any evidence of hematoma or bleeding.  Lumbar MRI was unremarkable for hematoma/bleeding however there is 5 mm spinal listhesis at L5-S1 resulting in facet hypertrophy and moderate to severe bilateral L5 foraminal stenosis.   Laboratory Chemistry Profile   Renal Lab Results  Component Value Date   BUN 23 (H) 06/09/2021   CREATININE 0.92 06/09/2021   GFRAA >60 10/26/2019   GFRNONAA >60 06/09/2021    Hepatic No results found for:  AST,  ALT, ALBUMIN, ALKPHOS, HCVAB, AMYLASE, LIPASE, AMMONIA  Electrolytes Lab Results  Component Value Date   NA 137 06/09/2021   K 3.6 06/09/2021   CL 101 06/09/2021   CALCIUM 9.1 06/09/2021    Bone No results found for: VD25OH, VD125OH2TOT, TD4287GO1, LX7262MB5, 25OHVITD1, 25OHVITD2, 25OHVITD3, TESTOFREE, TESTOSTERONE  Inflammation (CRP: Acute Phase) (ESR: Chronic Phase) No results found for: CRP, ESRSEDRATE, LATICACIDVEN       Note: Above Lab results reviewed.  Imaging  MR Lumbar Spine W Wo Contrast CLINICAL DATA:  Initial evaluation for acute low back pain, bilateral leg weakness for 1 week, cauda equina syndrome suspected.  EXAM: MRI LUMBAR SPINE WITHOUT AND WITH CONTRAST  TECHNIQUE: Multiplanar and multiecho pulse sequences of the lumbar spine were obtained without and with intravenous contrast.  CONTRAST:  41m GADAVIST GADOBUTROL 1 MMOL/ML IV SOLN  COMPARISON:  MRI from 05/18/2020.  FINDINGS: Segmentation: Standard. Lowest well-formed disc space labeled the L5-S1 level.  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 Alignment otherwise normal with preservation of normal lumbar lordosis.  Vertebrae: Vertebral body height maintained without acute or chronic fracture. Bone marrow signal intensity within normal limits. No discrete or worrisome osseous lesions. No abnormal marrow edema or enhancement.  Conus medullaris and cauda equina: Conus extends to the L1 level. Conus and cauda equina appear normal.  Paraspinal and other soft tissues: Unremarkable.  Disc levels:  T11-12: Small left paracentral disc protrusion mildly indents the left ventral thecal sac (series 8, image 3). No significant canal or lateral recess stenosis. Foramina remain patent.  T12-L1: Unremarkable.  L1-2:  Unremarkable.  L2-3:  Unremarkable.  L3-4: Negative interspace. Mild facet hypertrophy. No canal or foraminal  stenosis.  L4-5: Negative interspace. Moderate left with mild right facet hypertrophy. Resultant mild narrowing of the left lateral recess. Foramina remain patent.  L5-S1: Chronic bilateral pars defects with associated 5 mm spondylolisthesis. Associated broad posterior pseudo disc bulge/uncovering. Disc desiccation with intervertebral disc space narrowing. Mild reactive endplate spurring. Mild facet hypertrophy. No canal or lateral recess stenosis. Moderate bilateral L5 foraminal narrowing.  IMPRESSION: 1. No acute abnormality within the lumbar spine. No evidence for cord compression. 2. 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 3. Moderate left greater than right facet hypertrophy at L4-5 with mild left lateral recess stenosis. 4. Small left paracentral disc protrusion at T11-12 without stenosis.  Electronically Signed   By: BJeannine BogaM.D.   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 Diagnoses of Anterolisthesis of lumbar spine, Pars defect of lumbar spine, Lumbar radiculopathy, and Chronic pain of both knees were also pertinent to this visit.  Plan of Care   Based upon recent ED visit  and lack of response to epidural steroid injection, will place referral for neurosurgery to consider surgical decompression for L5 pars defect with associated 5 mm spondylolisthesis in the context of worsening radicular pain.   Orders:  Orders Placed This Encounter  Procedures   Ambulatory referral to Neurosurgery    Referral Priority:   Routine    Referral Type:   Surgical    Referral Reason:   Specialty Services Required    Referred to Provider:   YMeade Maw MD    Requested Specialty:   Neurosurgery    Number of Visits  Requested:   1    Follow-up  plan:   No follow-ups on file.     Left L5/S1 ESI 06/06/21   Recent Visits Date Type Provider Dept  06/06/21 Procedure visit [name], MD [facility]  05/24/21 Office Visit [name], MD [facility]  Showing recent visits within past 90 days and meeting all other requirements Today's Visits Date Type Provider Dept  06/13/21 Office Visit [name], MD [facility]  Showing today's visits and meeting all other requirements Future Appointments Date Type Provider Dept  07/06/21 Appointment [name], MD [facility]  Showing future appointments within next 90 days and meeting all other requirements I discussed the assessment and treatment plan with the patient. The patient was provided an opportunity to ask questions and all were answered. The patient agreed with the plan and demonstrated an understanding of the instructions.  Patient advised to call back or seek an in-person evaluation if the symptoms or condition worsens.  Duration of encounter: 37mnutes.  Note by: BGillis Santa MD Date: 06/13/2021; Time: 3:36 PM

## 2021-06-14 ENCOUNTER — Ambulatory Visit (HOSPITAL_BASED_OUTPATIENT_CLINIC_OR_DEPARTMENT_OTHER)
Payer: No Typology Code available for payment source | Admitting: Student in an Organized Health Care Education/Training Program

## 2021-06-14 DIAGNOSIS — M48061 Spinal stenosis, lumbar region without neurogenic claudication: Secondary | ICD-10-CM

## 2021-06-15 NOTE — Progress Notes (Signed)
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

## 2021-07-05 ENCOUNTER — Other Ambulatory Visit: Payer: Self-pay

## 2021-07-05 ENCOUNTER — Encounter: Payer: Self-pay | Admitting: Podiatry

## 2021-07-05 ENCOUNTER — Encounter: Payer: Self-pay | Admitting: Student in an Organized Health Care Education/Training Program

## 2021-07-05 ENCOUNTER — Ambulatory Visit: Payer: BC Managed Care – PPO | Admitting: Podiatry

## 2021-07-05 DIAGNOSIS — M722 Plantar fascial fibromatosis: Secondary | ICD-10-CM | POA: Diagnosis not present

## 2021-07-06 ENCOUNTER — Ambulatory Visit
Payer: No Typology Code available for payment source | Attending: Student in an Organized Health Care Education/Training Program | Admitting: Student in an Organized Health Care Education/Training Program

## 2021-07-06 DIAGNOSIS — M48061 Spinal stenosis, lumbar region without neurogenic claudication: Secondary | ICD-10-CM

## 2021-07-06 NOTE — Progress Notes (Signed)
Patient following up on 06/14/2021.  No appointment needed today.  Referral placed to neurosurgery.

## 2021-07-16 NOTE — Progress Notes (Signed)
° °  HPI: 31 y.o. male presenting today for follow-up evaluation of plantar fasciitis to the right foot.  Patient states that he is doing much better.  He has some very slight tenderness in the arch of the foot but overall significant improvement.  He took the prednisone pack and meloxicam as prescribed.  He is also been wearing the plantar fascial band  Past Medical History:  Diagnosis Date   Hypertension     No past surgical history on file.  Allergies  Allergen Reactions   Soy Allergy Shortness Of Breath     Physical Exam: General: The patient is alert and oriented x3 in no acute distress.  Dermatology: Skin is warm, dry and supple bilateral lower extremities. Negative for open lesions or macerations.  Vascular: Palpable pedal pulses bilaterally. Capillary refill within normal limits.  Negative for any significant edema or erythema  Neurological: Light touch and protective threshold grossly intact  Musculoskeletal Exam: No pedal deformities noted.  There is only minimal tenderness to palpation along the plantar fascia   Assessment: 1.  Plantar fasciitis right; improved   Plan of Care:  1. Patient evaluated. 2.  Continue meloxicam 15 mg daily as needed 3.  Continue plantar fascial brace as needed 4.  Custom molded orthotics at the [place] are pending 5.  Return to clinic as needed  [employer]      Felecia Shelling, [place] [facility]  Dr. Felecia Shelling, DPM    [address] [place], [place] [address]                Office [phone]  Fax [phone]

## 2021-08-16 IMAGING — CR DG CHEST 2V
1 series · 2 of 2 positions shown · non-contrast
Comparison: None.

CLINICAL DATA: Fever and cough.

EXAM:
CHEST - 2 VIEW

[Series 1: dg chest 2 view · 0.14mm/px · 2 of 2 slices shown]
[im 1/2]
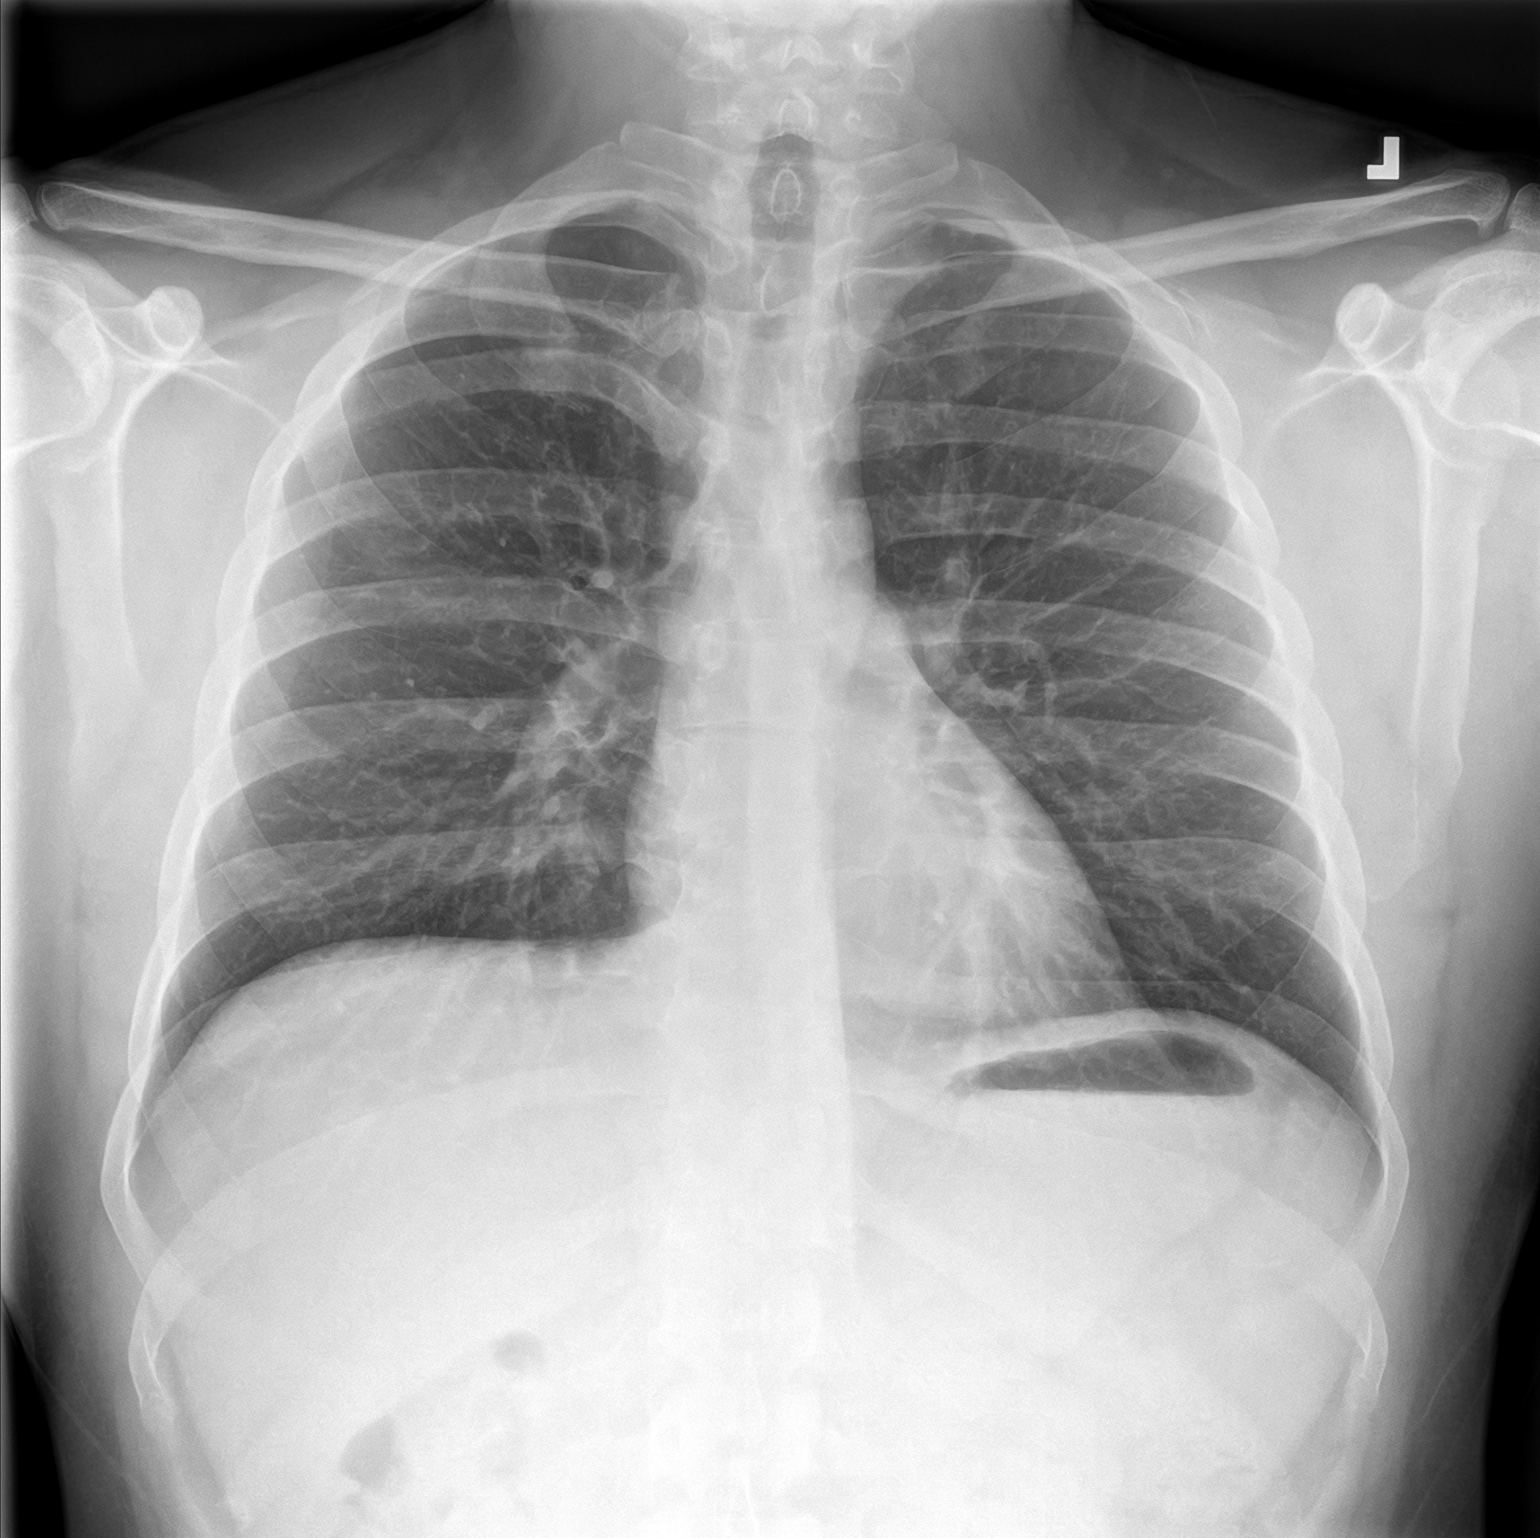
[im 2/2]
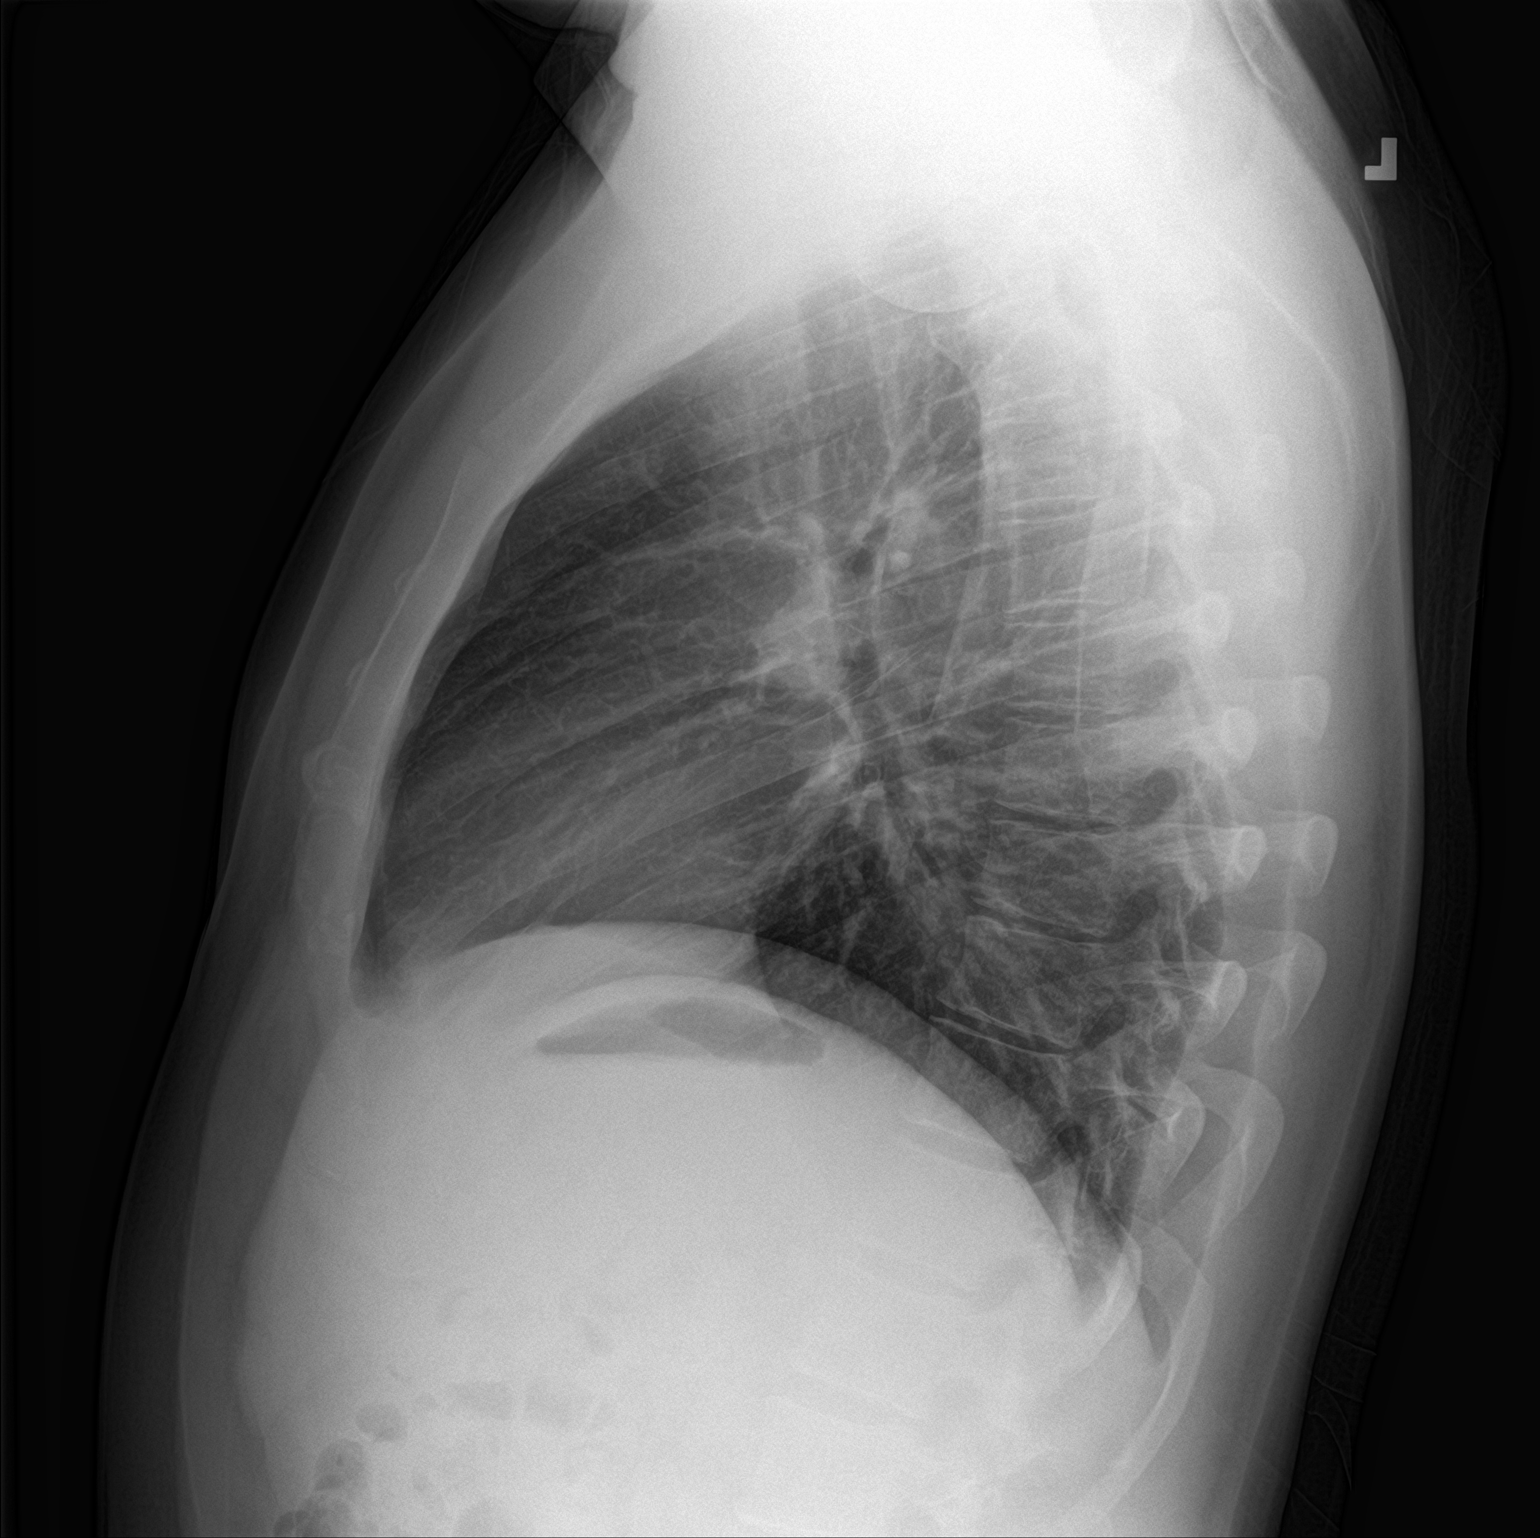

[2 of 2 positions shown; findings below may reference images not displayed]

FINDINGS: The heart size and mediastinal contours are within normal limits.
Both lungs are clear. The visualized skeletal structures are
unremarkable.
IMPRESSION: No active cardiopulmonary disease.

## 2021-08-29 ENCOUNTER — Other Ambulatory Visit: Payer: Self-pay | Admitting: Podiatry

## 2021-10-03 ENCOUNTER — Other Ambulatory Visit: Payer: Self-pay | Admitting: Neurosurgery

## 2021-10-03 DIAGNOSIS — Z01818 Encounter for other preprocedural examination: Secondary | ICD-10-CM

## 2021-10-17 ENCOUNTER — Encounter
Admission: RE | Admit: 2021-10-17 | Discharge: 2021-10-17 | Disposition: A | Discharge status: Home / Self Care | Payer: No Typology Code available for payment source | Source: Ambulatory Visit | Attending: Neurosurgery | Admitting: Neurosurgery

## 2021-10-17 ENCOUNTER — Other Ambulatory Visit: Payer: Self-pay

## 2021-10-17 VITALS — BP 115/80 | HR 78 | Resp 16 | Ht 68.0 in | Wt 217.0 lb

## 2021-10-17 DIAGNOSIS — G4733 Obstructive sleep apnea (adult) (pediatric): Secondary | ICD-10-CM | POA: Diagnosis not present

## 2021-10-17 DIAGNOSIS — I10 Essential (primary) hypertension: Secondary | ICD-10-CM

## 2021-10-17 DIAGNOSIS — Z01818 Encounter for other preprocedural examination: Secondary | ICD-10-CM

## 2021-10-17 DIAGNOSIS — Z01812 Encounter for preprocedural laboratory examination: Secondary | ICD-10-CM | POA: Insufficient documentation

## 2021-10-17 HISTORY — DX: Sleep apnea, unspecified: G47.30

## 2021-10-17 HISTORY — DX: Anxiety disorder, unspecified: F41.9

## 2021-10-17 HISTORY — DX: Depression, unspecified: F32.A

## 2021-10-17 HISTORY — DX: Headache, unspecified: R51.9

## 2021-10-17 LAB — BASIC METABOLIC PANEL
Anion gap: 10 (ref 5–15)
BUN: 13 mg/dL (ref 6–20)
CO2: 24 mmol/L (ref 22–32)
Calcium: 9.5 mg/dL (ref 8.9–10.3)
Chloride: 104 mmol/L (ref 98–111)
Creatinine, Ser: 0.78 mg/dL (ref 0.61–1.24)
GFR, Estimated: >60 mL/min (ref >60)
Glucose, Bld: 87 mg/dL (ref 70–99)
Potassium: 3.7 mmol/L (ref 3.5–5.1)
Sodium: 138 mmol/L (ref 135–145)

## 2021-10-17 LAB — CBC
HCT: 45.7 % (ref 39.0–52.0)
Hemoglobin: 16.3 g/dL (ref 13.0–17.0)
MCH: 30.4 pg (ref 26.0–34.0)
MCHC: 35.7 g/dL (ref 30.0–36.0)
MCV: 85.1 fL (ref 80.0–100.0)
Platelets: 231 10*3/uL (ref 150–400)
RBC: 5.37 MIL/uL (ref 4.22–5.81)
RDW: 11.8 % (ref 11.5–15.5)
WBC: 9.2 10*3/uL (ref 4.0–10.5)
nRBC: 0 % (ref 0.0–0.2)

## 2021-10-17 LAB — URINALYSIS, ROUTINE W REFLEX MICROSCOPIC
Bilirubin Urine: NEGATIVE
Glucose, UA: NEGATIVE mg/dL
Hgb urine dipstick: NEGATIVE
Ketones, ur: NEGATIVE mg/dL
Leukocytes,Ua: NEGATIVE
Nitrite: NEGATIVE
Protein, ur: NEGATIVE mg/dL
Specific Gravity, Urine: 1.015 (ref 1.005–1.030)
pH: 8 (ref 5.0–8.0)

## 2021-10-17 LAB — SURGICAL PCR SCREEN
MRSA, PCR: NEGATIVE
Staphylococcus aureus: POSITIVE — AB

## 2021-10-17 NOTE — Patient Instructions (Signed)
Your procedure is scheduled on: 10/26/21 Report to [place]. To find out your arrival time please call [phone] between 1PM - 3PM on 10/25/21.  Remember: Instructions that are not followed completely may result in serious medical risk, up to and including death, or upon the discretion of your surgeon and anesthesiologist your surgery may need to be rescheduled.     _X__ 1. Do not eat food after midnight the night before your procedure.                 No gum chewing or hard candies. You may drink clear liquids up to 2 hours                 before you are scheduled to arrive for your surgery- DO not drink clear                 liquids within 2 hours of the start of your surgery.                 Clear Liquids include:  water, apple juice without pulp, clear carbohydrate                 drink such as Clearfast or Gatorade, Black Coffee or Tea (Do not add                 anything to coffee or tea). Diabetics water only  __X__2.  On the morning of surgery brush your teeth with toothpaste and water, you                 may rinse your mouth with mouthwash if you wish.  Do not swallow any              toothpaste of mouthwash.     _X__ 3.  No Alcohol for 24 hours before or after surgery.   _X__ 4.  Do Not Smoke or use e-cigarettes For 24 Hours Prior to Your Surgery.                 Do not use any chewable tobacco products for at least 6 hours prior to                 surgery.  ____  5.  Bring all medications with you on the day of surgery if instructed.   __X__  6.  Notify your doctor if there is any change in your medical condition      (cold, fever, infections).     Do not wear jewelry, make-up, hairpins, clips or nail polish. Do not wear lotions, powders, or perfumes. No deodorant Do not shave body hair 48 hours prior to surgery. Men may shave face and neck. Do not bring valuables to the hospital.    [facility] is not responsible  for any belongings or valuables.  Contacts, dentures/partials or body piercings may not be worn into surgery. Bring a case for your contacts, glasses or hearing aids, a denture cup will be supplied. Leave your suitcase in the car. After surgery it may be brought to your room. For patients admitted to the hospital, discharge time is determined by your treatment team.   Patients discharged the day of surgery will not be allowed to drive home.   Please read over the following fact sheets that you were given:   MRSA Information, CHG soap  __X__ Take these medicines the morning of surgery with A  SIP OF WATER:    1. gabapentin (NEURONTIN) 300 MG capsule  2. escitalopram (LEXAPRO) 10 MG tablet  3.   4.  5.  6.  ____ Fleet Enema (as directed)   __X__ Use CHG Soap/SAGE wipes as directed  ____ Use inhalers on the day of surgery  ____ Stop metformin/Janumet/Farxiga 2 days prior to surgery    ____ Take 1/2 of usual insulin dose the night before surgery. No insulin the morning          of surgery.   ____ Stop Blood Thinners Coumadin/Plavix/Xarelto/Pleta/Pradaxa/Eliquis/Effient/Aspirin  on   Or contact your Surgeon, Cardiologist or Medical Doctor regarding  ability to stop your blood thinners  __X__ Stop Anti-inflammatories 7 days before surgery such as Advil, Ibuprofen, Motrin,  BC or Goodies Powder, Naprosyn, Naproxen, Aleve, Aspirin   Stop Meloxicam 7 days prior to surgery. May take Tylenol  __X__ Stop all herbals and supplements, fish oil or vitamins  until after surgery.    __X__ Bring C-Pap to the hospital.

## 2021-10-25 MED ORDER — CEFAZOLIN SODIUM-DEXTROSE 2-4 GM/100ML-% IV SOLN
2.0000 g | INTRAVENOUS | Status: AC
  Administered 2021-10-26 (×2): 2 g via INTRAVENOUS

## 2021-10-25 MED ORDER — FAMOTIDINE 20 MG PO TABS: 20.0000 mg | ORAL_TABLET | Freq: Once | ORAL | Status: AC

## 2021-10-25 MED ORDER — CHLORHEXIDINE GLUCONATE 0.12 % MT SOLN: 15.0000 mL | Freq: Once | OROMUCOSAL | Status: AC

## 2021-10-25 MED ORDER — VANCOMYCIN HCL 1500 MG/300ML IV SOLN
1500.0000 mg | INTRAVENOUS | Status: AC
  Administered 2021-10-26: 1500 mg via INTRAVENOUS
  Filled 2021-10-25: qty 300

## 2021-10-25 MED ORDER — LACTATED RINGERS IV SOLN
INTRAVENOUS | Status: DC
Start: 2021-10-25 — End: 2021-10-26

## 2021-10-25 MED ORDER — ORAL CARE MOUTH RINSE: 15.0000 mL | Freq: Once | OROMUCOSAL | Status: AC

## 2021-10-25 MED ORDER — VANCOMYCIN HCL 1500 MG/300ML IV SOLN
1500.0000 mg | INTRAVENOUS | Status: DC
  Filled 2021-10-25: qty 300

## 2021-10-25 NOTE — Progress Notes (Signed)
Pharmacy Antibiotic Note ? ?Shawn Medina is a 31 y.o. male admitted on (Not on file) with surgical prophylaxis.  Pharmacy has been consulted for Vancomycin, Cefazolin dosing. ? ?Plan: ?TBW = 98.4 kg  ? ?Cefazolin 2 gm IV X 1 60 min pre-op ordered for 4/26 @ 0500. ? ?Vancomycin 1500 mg (15 mg/kg) IV X 1 60 min pre-op ordered for 4/26 @ 0500.  ? ?  ? ?No data recorded. ? ?No results for input(s): WBC, CREATININE, LATICACIDVEN, VANCOTROUGH, VANCOPEAK, VANCORANDOM, GENTTROUGH, GENTPEAK, GENTRANDOM, TOBRATROUGH, TOBRAPEAK, TOBRARND, AMIKACINPEAK, AMIKACINTROU, AMIKACIN in the last 168 hours.  ?Estimated Creatinine Clearance: 152.1 mL/min (by C-G formula based on SCr of 0.78 mg/dL).   ? ?Allergies  ?Allergen Reactions  ? Soy Allergy Shortness Of Breath  ? Oxycodone Hcl Nausea And Vomiting  ?  Felt like he was going to die  ? ? ?Antimicrobials this admission: ?  >>  ?  >>  ? ?Dose adjustments this admission: ? ? ?Microbiology results: ? BCx:  ? UCx:   ? Sputum:   ? MRSA PCR:  ? ?Thank you for allowing pharmacy to be a part of this patient?s care. ? ?Sheyla Zaffino D ?10/25/2021 10:35 PM ? ?

## 2021-10-26 ENCOUNTER — Encounter
Admission: RE | Disposition: A | Discharge status: Home / Self Care | Payer: Self-pay | Source: Home / Self Care | Attending: Neurosurgery

## 2021-10-26 ENCOUNTER — Inpatient Hospital Stay: Payer: No Typology Code available for payment source

## 2021-10-26 ENCOUNTER — Other Ambulatory Visit: Payer: Self-pay

## 2021-10-26 ENCOUNTER — Inpatient Hospital Stay: Payer: No Typology Code available for payment source | Admitting: Certified Registered"

## 2021-10-26 ENCOUNTER — Inpatient Hospital Stay: Payer: No Typology Code available for payment source | Admitting: Urgent Care

## 2021-10-26 ENCOUNTER — Inpatient Hospital Stay
Admission: RE | Admit: 2021-10-26 | Discharge: 2021-10-30 | DRG: 455 | Disposition: A | Discharge status: Home / Self Care | Payer: No Typology Code available for payment source | Attending: Neurosurgery | Admitting: Neurosurgery

## 2021-10-26 ENCOUNTER — Encounter: Payer: Self-pay | Admitting: Neurosurgery

## 2021-10-26 DIAGNOSIS — Z79899 Other long term (current) drug therapy: Secondary | ICD-10-CM

## 2021-10-26 DIAGNOSIS — Z885 Allergy status to narcotic agent status: Secondary | ICD-10-CM

## 2021-10-26 DIAGNOSIS — M48061 Spinal stenosis, lumbar region without neurogenic claudication: Secondary | ICD-10-CM | POA: Diagnosis present

## 2021-10-26 DIAGNOSIS — M4306 Spondylolysis, lumbar region: Secondary | ICD-10-CM | POA: Diagnosis not present

## 2021-10-26 DIAGNOSIS — E785 Hyperlipidemia, unspecified: Secondary | ICD-10-CM | POA: Diagnosis present

## 2021-10-26 DIAGNOSIS — M4316 Spondylolisthesis, lumbar region: Secondary | ICD-10-CM | POA: Diagnosis present

## 2021-10-26 DIAGNOSIS — M545 Low back pain, unspecified: Secondary | ICD-10-CM | POA: Diagnosis present

## 2021-10-26 DIAGNOSIS — Z91018 Allergy to other foods: Secondary | ICD-10-CM | POA: Diagnosis not present

## 2021-10-26 DIAGNOSIS — M4310 Spondylolisthesis, site unspecified: Secondary | ICD-10-CM | POA: Diagnosis not present

## 2021-10-26 DIAGNOSIS — M4317 Spondylolisthesis, lumbosacral region: Principal | ICD-10-CM | POA: Diagnosis present

## 2021-10-26 DIAGNOSIS — Z981 Arthrodesis status: Principal | ICD-10-CM

## 2021-10-26 DIAGNOSIS — I10 Essential (primary) hypertension: Secondary | ICD-10-CM | POA: Diagnosis present

## 2021-10-26 DIAGNOSIS — G4730 Sleep apnea, unspecified: Secondary | ICD-10-CM | POA: Diagnosis present

## 2021-10-26 HISTORY — PX: ANTERIOR AND POSTERIOR SPINAL FUSION: SHX2259

## 2021-10-26 HISTORY — PX: ABDOMINAL EXPOSURE: SHX5708

## 2021-10-26 HISTORY — PX: APPLICATION OF INTRAOPERATIVE CT SCAN: SHX6668

## 2021-10-26 LAB — ABO/RH: ABO/RH(D): B POS

## 2021-10-26 SURGERY — ANTERIOR AND POSTERIOR SPINAL FUSION
Anesthesia: General | Site: Spine Lumbar

## 2021-10-26 MED ORDER — VERAPAMIL HCL ER 120 MG PO TBCR
120.0000 mg | EXTENDED_RELEASE_TABLET | Freq: Every day | ORAL | Status: DC
  Administered 2021-10-26: 120 mg via ORAL
  Filled 2021-10-26: qty 1

## 2021-10-26 MED ORDER — MIDAZOLAM HCL 2 MG/2ML IJ SOLN
INTRAMUSCULAR | Status: DC | PRN
  Administered 2021-10-26: 2 mg via INTRAVENOUS

## 2021-10-26 MED ORDER — FLEET ENEMA 7-19 GM/118ML RE ENEM: 1.0000 | ENEMA | Freq: Once | RECTAL | Status: DC | PRN

## 2021-10-26 MED ORDER — DOCUSATE SODIUM 100 MG PO CAPS
100.0000 mg | ORAL_CAPSULE | Freq: Two times a day (BID) | ORAL | Status: DC
  Administered 2021-10-26 – 2021-10-30 (×8): 100 mg via ORAL
  Filled 2021-10-26 (×8): qty 1

## 2021-10-26 MED ORDER — FENTANYL CITRATE (PF) 100 MCG/2ML IJ SOLN
25.0000 ug | INTRAMUSCULAR | Status: DC | PRN
  Administered 2021-10-26: 50 ug via INTRAVENOUS

## 2021-10-26 MED ORDER — PROPOFOL 1000 MG/100ML IV EMUL
INTRAVENOUS | Status: AC
Start: 2021-10-26 — End: ?
  Filled 2021-10-26: qty 100

## 2021-10-26 MED ORDER — FAMOTIDINE 20 MG PO TABS
ORAL_TABLET | ORAL | Status: AC
  Administered 2021-10-26: 20 mg via ORAL
  Filled 2021-10-26: qty 1

## 2021-10-26 MED ORDER — DEXMEDETOMIDINE (PRECEDEX) IN NS 20 MCG/5ML (4 MCG/ML) IV SYRINGE
PREFILLED_SYRINGE | INTRAVENOUS | Status: DC | PRN
  Administered 2021-10-26: 4 ug via INTRAVENOUS
  Administered 2021-10-26: 8 ug via INTRAVENOUS
  Administered 2021-10-26 (×2): 4 ug via INTRAVENOUS
  Administered 2021-10-26: 8 ug via INTRAVENOUS
  Administered 2021-10-26: 4 ug via INTRAVENOUS
  Administered 2021-10-26: 8 ug via INTRAVENOUS

## 2021-10-26 MED ORDER — SURGIFLO WITH THROMBIN (HEMOSTATIC MATRIX KIT) OPTIME
TOPICAL | Status: DC | PRN
  Administered 2021-10-26: 1 via TOPICAL

## 2021-10-26 MED ORDER — LACTATED RINGERS IV SOLN: INTRAVENOUS | Status: DC | PRN

## 2021-10-26 MED ORDER — HYDROCODONE-ACETAMINOPHEN 5-325 MG PO TABS
1.0000 | ORAL_TABLET | ORAL | Status: DC | PRN
  Administered 2021-10-26: 1 via ORAL

## 2021-10-26 MED ORDER — ACETAMINOPHEN 650 MG RE SUPP: 650.0000 mg | RECTAL | Status: DC | PRN

## 2021-10-26 MED ORDER — POLYETHYLENE GLYCOL 3350 17 G PO PACK
17.0000 g | PACK | Freq: Every day | ORAL | Status: DC | PRN
  Administered 2021-10-28: 17 g via ORAL
  Filled 2021-10-26: qty 1

## 2021-10-26 MED ORDER — HYDROMORPHONE HCL 1 MG/ML IJ SOLN
INTRAMUSCULAR | Status: AC
  Administered 2021-10-26: 0.5 mg via INTRAVENOUS
  Filled 2021-10-26: qty 1

## 2021-10-26 MED ORDER — ATORVASTATIN CALCIUM 20 MG PO TABS
40.0000 mg | ORAL_TABLET | Freq: Every day | ORAL | Status: DC
  Administered 2021-10-26: 40 mg via ORAL
  Filled 2021-10-26: qty 2

## 2021-10-26 MED ORDER — ENOXAPARIN SODIUM 40 MG/0.4ML IJ SOSY
40.0000 mg | PREFILLED_SYRINGE | INTRAMUSCULAR | Status: DC
  Administered 2021-10-27 – 2021-10-30 (×4): 40 mg via SUBCUTANEOUS
  Filled 2021-10-26 (×4): qty 0.4

## 2021-10-26 MED ORDER — SODIUM CHLORIDE 0.9 % IV SOLN: 250.0000 mL | INTRAVENOUS | Status: DC

## 2021-10-26 MED ORDER — FENTANYL CITRATE (PF) 100 MCG/2ML IJ SOLN
INTRAMUSCULAR | Status: AC
  Administered 2021-10-26: 50 ug via INTRAVENOUS
  Filled 2021-10-26: qty 2

## 2021-10-26 MED ORDER — SODIUM CHLORIDE FLUSH 0.9 % IV SOLN
INTRAVENOUS | Status: AC
Start: 2021-10-26 — End: ?
  Filled 2021-10-26: qty 20

## 2021-10-26 MED ORDER — METHOCARBAMOL 1000 MG/10ML IJ SOLN
500.0000 mg | Freq: Four times a day (QID) | INTRAVENOUS | Status: DC
  Administered 2021-10-26: 500 mg via INTRAVENOUS
  Filled 2021-10-26 (×2): qty 5

## 2021-10-26 MED ORDER — SODIUM CHLORIDE 0.9% FLUSH
3.0000 mL | Freq: Two times a day (BID) | INTRAVENOUS | Status: DC
  Administered 2021-10-26 – 2021-10-29 (×7): 3 mL via INTRAVENOUS

## 2021-10-26 MED ORDER — ACETAMINOPHEN 10 MG/ML IV SOLN
INTRAVENOUS | Status: AC
Start: 2021-10-26 — End: ?
  Filled 2021-10-26: qty 100

## 2021-10-26 MED ORDER — SENNA 8.6 MG PO TABS
1.0000 | ORAL_TABLET | Freq: Two times a day (BID) | ORAL | Status: DC
  Administered 2021-10-26 – 2021-10-29 (×7): 8.6 mg via ORAL
  Filled 2021-10-26 (×8): qty 1

## 2021-10-26 MED ORDER — FENTANYL CITRATE (PF) 100 MCG/2ML IJ SOLN
INTRAMUSCULAR | Status: AC
Start: 2021-10-26 — End: ?
  Filled 2021-10-26: qty 2

## 2021-10-26 MED ORDER — PROPOFOL 1000 MG/100ML IV EMUL
INTRAVENOUS | Status: AC
  Filled 2021-10-26: qty 100

## 2021-10-26 MED ORDER — REMIFENTANIL HCL 1 MG IV SOLR
INTRAVENOUS | Status: AC
  Filled 2021-10-26: qty 1000

## 2021-10-26 MED ORDER — BUPIVACAINE-EPINEPHRINE (PF) 0.5% -1:200000 IJ SOLN
INTRAMUSCULAR | Status: DC | PRN
  Administered 2021-10-26: 10 mL

## 2021-10-26 MED ORDER — PHENYLEPHRINE HCL (PRESSORS) 10 MG/ML IV SOLN
INTRAVENOUS | Status: DC | PRN
Start: 2021-10-26 — End: 2021-10-26
  Administered 2021-10-26 (×3): 160 ug via INTRAVENOUS

## 2021-10-26 MED ORDER — BUPIVACAINE HCL (PF) 0.5 % IJ SOLN
INTRAMUSCULAR | Status: AC
  Filled 2021-10-26: qty 30

## 2021-10-26 MED ORDER — ACETAMINOPHEN 325 MG PO TABS
650.0000 mg | ORAL_TABLET | ORAL | Status: DC | PRN
  Administered 2021-10-28: 650 mg via ORAL
  Filled 2021-10-26: qty 2

## 2021-10-26 MED ORDER — BUPIVACAINE LIPOSOME 1.3 % IJ SUSP
INTRAMUSCULAR | Status: DC | PRN
  Administered 2021-10-26: 20 mL

## 2021-10-26 MED ORDER — KETOROLAC TROMETHAMINE 15 MG/ML IJ SOLN
15.0000 mg | Freq: Four times a day (QID) | INTRAMUSCULAR | Status: AC
  Administered 2021-10-26 – 2021-10-28 (×7): 15 mg via INTRAVENOUS
  Filled 2021-10-26 (×6): qty 1

## 2021-10-26 MED ORDER — SODIUM CHLORIDE (PF) 0.9 % IJ SOLN: INTRAMUSCULAR | Status: DC | PRN

## 2021-10-26 MED ORDER — GLYCOPYRROLATE 0.2 MG/ML IJ SOLN
INTRAMUSCULAR | Status: DC | PRN
  Administered 2021-10-26: .2 mg via INTRAVENOUS

## 2021-10-26 MED ORDER — MENTHOL 3 MG MT LOZG
1.0000 | LOZENGE | OROMUCOSAL | Status: DC | PRN
  Filled 2021-10-26: qty 9

## 2021-10-26 MED ORDER — PHENOL 1.4 % MT LIQD
1.0000 | OROMUCOSAL | Status: DC | PRN
  Filled 2021-10-26: qty 177

## 2021-10-26 MED ORDER — ACETAMINOPHEN 10 MG/ML IV SOLN
INTRAVENOUS | Status: DC | PRN
  Administered 2021-10-26: 1000 mg via INTRAVENOUS

## 2021-10-26 MED ORDER — MIDAZOLAM HCL 2 MG/2ML IJ SOLN
INTRAMUSCULAR | Status: AC
  Filled 2021-10-26: qty 2

## 2021-10-26 MED ORDER — HYDROCODONE-ACETAMINOPHEN 5-325 MG PO TABS
2.0000 | ORAL_TABLET | ORAL | Status: DC | PRN
  Administered 2021-10-27 – 2021-10-30 (×6): 2 via ORAL
  Filled 2021-10-26 (×7): qty 2

## 2021-10-26 MED ORDER — SUGAMMADEX SODIUM 200 MG/2ML IV SOLN
INTRAVENOUS | Status: DC | PRN
Start: 2021-10-26 — End: 2021-10-26
  Administered 2021-10-26: 200 mg via INTRAVENOUS

## 2021-10-26 MED ORDER — BUPIVACAINE LIPOSOME 1.3 % IJ SUSP
INTRAMUSCULAR | Status: AC
  Filled 2021-10-26: qty 20

## 2021-10-26 MED ORDER — ONDANSETRON HCL 4 MG/2ML IJ SOLN
4.0000 mg | Freq: Four times a day (QID) | INTRAMUSCULAR | Status: DC | PRN
  Administered 2021-10-26 – 2021-10-30 (×4): 4 mg via INTRAVENOUS
  Filled 2021-10-26 (×4): qty 2

## 2021-10-26 MED ORDER — CEFAZOLIN SODIUM-DEXTROSE 2-4 GM/100ML-% IV SOLN
INTRAVENOUS | Status: AC
  Filled 2021-10-26: qty 100

## 2021-10-26 MED ORDER — BISACODYL 10 MG RE SUPP: 10.0000 mg | Freq: Every day | RECTAL | Status: DC | PRN

## 2021-10-26 MED ORDER — KETAMINE HCL 10 MG/ML IJ SOLN
INTRAMUSCULAR | Status: DC | PRN
  Administered 2021-10-26: 30 mg via INTRAVENOUS
  Administered 2021-10-26 (×4): 10 mg via INTRAVENOUS

## 2021-10-26 MED ORDER — BUPIVACAINE-EPINEPHRINE (PF) 0.5% -1:200000 IJ SOLN
INTRAMUSCULAR | Status: AC
  Filled 2021-10-26: qty 30

## 2021-10-26 MED ORDER — DEXAMETHASONE SODIUM PHOSPHATE 10 MG/ML IJ SOLN
INTRAMUSCULAR | Status: DC | PRN
Start: 2021-10-26 — End: 2021-10-26
  Administered 2021-10-26: 10 mg via INTRAVENOUS

## 2021-10-26 MED ORDER — SODIUM CHLORIDE 0.9 % IV SOLN
INTRAVENOUS | Status: DC | PRN
  Administered 2021-10-26: .05 ug/kg/min via INTRAVENOUS

## 2021-10-26 MED ORDER — ROCURONIUM BROMIDE 100 MG/10ML IV SOLN
INTRAVENOUS | Status: DC | PRN
  Administered 2021-10-26: 50 mg via INTRAVENOUS
  Administered 2021-10-26 (×5): 20 mg via INTRAVENOUS

## 2021-10-26 MED ORDER — LACTATED RINGERS IV SOLN: INTRAVENOUS | Status: DC

## 2021-10-26 MED ORDER — ONDANSETRON HCL 4 MG PO TABS: 4.0000 mg | ORAL_TABLET | Freq: Four times a day (QID) | ORAL | Status: DC | PRN

## 2021-10-26 MED ORDER — LIDOCAINE HCL (CARDIAC) PF 100 MG/5ML IV SOSY
PREFILLED_SYRINGE | INTRAVENOUS | Status: DC | PRN
  Administered 2021-10-26: 80 mg via INTRAVENOUS

## 2021-10-26 MED ORDER — PHENYLEPHRINE 80 MCG/ML (10ML) SYRINGE FOR IV PUSH (FOR BLOOD PRESSURE SUPPORT): PREFILLED_SYRINGE | INTRAVENOUS | Status: DC | PRN

## 2021-10-26 MED ORDER — PROPOFOL 500 MG/50ML IV EMUL
INTRAVENOUS | Status: DC | PRN
  Administered 2021-10-26: 75 ug/kg/min via INTRAVENOUS

## 2021-10-26 MED ORDER — METHOCARBAMOL 500 MG PO TABS
500.0000 mg | ORAL_TABLET | Freq: Four times a day (QID) | ORAL | Status: DC
  Administered 2021-10-26 – 2021-10-30 (×15): 500 mg via ORAL
  Filled 2021-10-26 (×15): qty 1

## 2021-10-26 MED ORDER — SODIUM CHLORIDE 0.9% FLUSH: 3.0000 mL | INTRAVENOUS | Status: DC | PRN

## 2021-10-26 MED ORDER — ONDANSETRON 4 MG PO TBDP
4.0000 mg | ORAL_TABLET | Freq: Four times a day (QID) | ORAL | Status: DC | PRN
  Filled 2021-10-26: qty 1

## 2021-10-26 MED ORDER — KETAMINE HCL 50 MG/5ML IJ SOSY
PREFILLED_SYRINGE | INTRAMUSCULAR | Status: AC
  Filled 2021-10-26: qty 5

## 2021-10-26 MED ORDER — HEMOSTATIC AGENTS (NO CHARGE) OPTIME
TOPICAL | Status: DC | PRN
  Administered 2021-10-26: 1 via TOPICAL

## 2021-10-26 MED ORDER — HYDROMORPHONE HCL 1 MG/ML IJ SOLN
0.2500 mg | INTRAMUSCULAR | Status: DC | PRN
  Administered 2021-10-26 (×2): 0.5 mg via INTRAVENOUS

## 2021-10-26 MED ORDER — FENTANYL CITRATE (PF) 100 MCG/2ML IJ SOLN
INTRAMUSCULAR | Status: AC
  Administered 2021-10-26: 25 ug via INTRAVENOUS
  Filled 2021-10-26: qty 2

## 2021-10-26 MED ORDER — 0.9 % SODIUM CHLORIDE (POUR BTL) OPTIME
TOPICAL | Status: DC | PRN
  Administered 2021-10-26: 1000 mL

## 2021-10-26 MED ORDER — PROPOFOL 10 MG/ML IV BOLUS
INTRAVENOUS | Status: DC | PRN
  Administered 2021-10-26: 200 mg via INTRAVENOUS

## 2021-10-26 MED ORDER — PHENYLEPHRINE HCL (PRESSORS) 10 MG/ML IV SOLN
INTRAVENOUS | Status: AC
  Filled 2021-10-26: qty 1

## 2021-10-26 MED ORDER — ONDANSETRON HCL 4 MG/2ML IJ SOLN
INTRAMUSCULAR | Status: DC | PRN
  Administered 2021-10-26 (×2): 4 mg via INTRAVENOUS

## 2021-10-26 MED ORDER — GABAPENTIN 300 MG PO CAPS: 300.0000 mg | ORAL_CAPSULE | Freq: Every day | ORAL | Status: DC | PRN

## 2021-10-26 MED ORDER — SODIUM CHLORIDE 0.9 % IV SOLN: INTRAVENOUS | Status: DC

## 2021-10-26 MED ORDER — MORPHINE SULFATE (PF) 2 MG/ML IV SOLN
2.0000 mg | INTRAVENOUS | Status: DC | PRN
  Administered 2021-10-26 – 2021-10-27 (×5): 2 mg via INTRAVENOUS
  Filled 2021-10-26 (×5): qty 1

## 2021-10-26 MED ORDER — ONDANSETRON HCL 4 MG/2ML IJ SOLN: 4.0000 mg | Freq: Once | INTRAMUSCULAR | Status: DC | PRN

## 2021-10-26 MED ORDER — HYDROCODONE-ACETAMINOPHEN 5-325 MG PO TABS
ORAL_TABLET | ORAL | Status: AC
  Filled 2021-10-26: qty 1

## 2021-10-26 MED ORDER — FENTANYL CITRATE (PF) 100 MCG/2ML IJ SOLN
INTRAMUSCULAR | Status: DC | PRN
  Administered 2021-10-26: 25 ug via INTRAVENOUS
  Administered 2021-10-26: 50 ug via INTRAVENOUS
  Administered 2021-10-26: 25 ug via INTRAVENOUS

## 2021-10-26 MED ORDER — SODIUM CHLORIDE FLUSH 0.9 % IV SOLN
INTRAVENOUS | Status: AC
  Administered 2021-10-26: 10 mL
  Filled 2021-10-26: qty 10

## 2021-10-26 MED ORDER — BUPIVACAINE HCL (PF) 0.5 % IJ SOLN
INTRAMUSCULAR | Status: DC | PRN
  Administered 2021-10-26: 30 mL

## 2021-10-26 MED ORDER — KETOROLAC TROMETHAMINE 15 MG/ML IJ SOLN
INTRAMUSCULAR | Status: AC
  Filled 2021-10-26: qty 1

## 2021-10-26 MED ORDER — PROPOFOL 10 MG/ML IV BOLUS
INTRAVENOUS | Status: AC
  Filled 2021-10-26: qty 20

## 2021-10-26 MED ORDER — PHENYLEPHRINE HCL-NACL 20-0.9 MG/250ML-% IV SOLN
INTRAVENOUS | Status: DC | PRN
  Administered 2021-10-26: 30 ug/min via INTRAVENOUS

## 2021-10-26 MED ORDER — ACETAMINOPHEN 10 MG/ML IV SOLN: 1000.0000 mg | Freq: Once | INTRAVENOUS | Status: DC | PRN

## 2021-10-26 MED ORDER — CHLORHEXIDINE GLUCONATE 0.12 % MT SOLN
OROMUCOSAL | Status: AC
  Administered 2021-10-26: 15 mL via OROMUCOSAL
  Filled 2021-10-26: qty 15

## 2021-10-26 SURGICAL SUPPLY — 111 items
BLADE CLIPPER SURG (BLADE) ×3 IMPLANT
CAP LOCKING SPINE (Cap) ×4 IMPLANT
CHLORAPREP W/TINT 26 (MISCELLANEOUS) ×12 IMPLANT
CORD BIP STRL DISP 12FT (MISCELLANEOUS) ×3 IMPLANT
COUNTER NEEDLE 20/40 LG (NEEDLE) ×6 IMPLANT
COVER BACK TABLE REUSABLE LG (DRAPES) ×3 IMPLANT
CUP MEDICINE 2OZ PLAST GRAD ST (MISCELLANEOUS) ×3 IMPLANT
DERMABOND ADVANCED (GAUZE/BANDAGES/DRESSINGS) ×4
DERMABOND ADVANCED .7 DNX12 (GAUZE/BANDAGES/DRESSINGS) ×6 IMPLANT
DRAPE 3D C-ARM OEC (DRAPES) ×4 IMPLANT
DRAPE C-ARM XRAY 36X54 (DRAPES) ×2 IMPLANT
DRAPE INCISE IOBAN 66X60 STRL (DRAPES) ×3 IMPLANT
DRAPE LAPAROTOMY 100X77 ABD (DRAPES) ×3 IMPLANT
DRAPE SCAN PATIENT (DRAPES) ×3 IMPLANT
DRAPE SURG 17X11 SM STRL (DRAPES) ×6 IMPLANT
DRESSING SURGICEL FIBRLLR 1X2 (HEMOSTASIS) IMPLANT
DRSG OPSITE POSTOP 4X6 (GAUZE/BANDAGES/DRESSINGS) ×2 IMPLANT
DRSG OPSITE POSTOP 4X8 (GAUZE/BANDAGES/DRESSINGS) ×1 IMPLANT
DRSG SURGICEL FIBRILLAR 1X2 (HEMOSTASIS) ×3
DRSG TEGADERM 4X4.75 (GAUZE/BANDAGES/DRESSINGS) ×1 IMPLANT
DRSG TEGADERM 6X8 (GAUZE/BANDAGES/DRESSINGS) ×1 IMPLANT
ELECT REM PT RETURN 9FT ADLT (ELECTROSURGICAL) ×6
ELECTRODE REM PT RTRN 9FT ADLT (ELECTROSURGICAL) ×4 IMPLANT
EX-PIN ORTHOLOCK NAV 4X150 (PIN) ×1 IMPLANT
FEE INTRAOP CADWELL SUPPLY NCS (MISCELLANEOUS) IMPLANT
FEE INTRAOP MONITOR IMPULS NCS (MISCELLANEOUS) IMPLANT
FORCEPS BPLR BAYO 10IN 1.0TIP (ORTHOPEDIC DISPOSABLE SUPPLIES) ×1 IMPLANT
GLOVE BIOGEL PI IND STRL 6.5 (GLOVE) ×4 IMPLANT
GLOVE BIOGEL PI INDICATOR 6.5 (GLOVE) ×2
GLOVE SURG SYN 6.5 ES PF (GLOVE) ×6 IMPLANT
GLOVE SURG SYN 6.5 PF PI (GLOVE) ×4 IMPLANT
GLOVE SURG SYN 7.0 (GLOVE) ×12 IMPLANT
GLOVE SURG SYN 7.0 PF PI (GLOVE) ×8 IMPLANT
GLOVE SURG SYN 8.5  E (GLOVE) ×6
GLOVE SURG SYN 8.5 E (GLOVE) ×12 IMPLANT
GLOVE SURG SYN 8.5 PF PI (GLOVE) ×12 IMPLANT
GLOVE SURG UNDER POLY LF SZ8.5 (GLOVE) ×9 IMPLANT
GOWN SRG LRG LVL 4 IMPRV REINF (GOWNS) ×4 IMPLANT
GOWN SRG XL LVL 3 NONREINFORCE (GOWNS) ×8 IMPLANT
GOWN STRL NON-REIN TWL XL LVL3 (GOWNS) ×12
GOWN STRL REIN LRG LVL4 (GOWNS) ×6
GOWN STRL REUS W/ TWL XL LVL3 (GOWN DISPOSABLE) ×8 IMPLANT
GOWN STRL REUS W/TWL XL LVL3 (GOWN DISPOSABLE) ×12
GRADUATE 1200CC STRL 31836 (MISCELLANEOUS) ×3 IMPLANT
INTRAOP CADWELL SUPPLY FEE NCS (MISCELLANEOUS)
INTRAOP DISP SUPPLY FEE NCS (MISCELLANEOUS)
INTRAOP MONITOR FEE IMPULS NCS (MISCELLANEOUS)
INTRAOP MONITOR FEE IMPULSE (MISCELLANEOUS)
K-WIRE 1.6 NITINOL SHARP TIP (WIRE) ×12
KIT DISP MARS 3V (KITS) ×1 IMPLANT
KIT INFUSE MEDIUM (Orthopedic Implant) ×1 IMPLANT
KIT SPINAL PRONEVIEW (KITS) ×3 IMPLANT
KIT TURNOVER KIT A (KITS) ×3 IMPLANT
KWIRE 1.6 NITINOL SHARP TIP (WIRE) IMPLANT
LOOP RED MAXI  1X406MM (MISCELLANEOUS) ×1
LOOP VESSEL MAXI 1X406 RED (MISCELLANEOUS) ×2 IMPLANT
LOOP VESSEL MINI 0.8X406 BLUE (MISCELLANEOUS) ×4 IMPLANT
LOOPS BLUE MINI 0.8X406MM (MISCELLANEOUS) ×1
MANIFOLD NEPTUNE II (INSTRUMENTS) ×5 IMPLANT
MARKER SKIN DUAL TIP RULER LAB (MISCELLANEOUS) ×8 IMPLANT
MARKER SPHERE PSV REFLC 13MM (MARKER) ×21 IMPLANT
NDL SAFETY ECLIPSE 18X1.5 (NEEDLE) ×2 IMPLANT
NEEDLE HYPO 18GX1.5 SHARP (NEEDLE) ×3
NEEDLE HYPO 22GX1.5 SAFETY (NEEDLE) ×6 IMPLANT
PACK LAMINECTOMY NEURO (CUSTOM PROCEDURE TRAY) ×3 IMPLANT
PACK UNIVERSAL (MISCELLANEOUS) ×3 IMPLANT
PAD ARMBOARD 7.5X6 YLW CONV (MISCELLANEOUS) ×2 IMPLANT
PENCIL ELECTRO HAND CTR (MISCELLANEOUS) ×3 IMPLANT
PLEDGET CV PTFE 7X3 (MISCELLANEOUS) IMPLANT
ROD SPINAL CREO AMP 5.5X60 CAN (Rod) ×1 IMPLANT
ROD SPINE CURVE 5.5X55 (Rod) ×1 IMPLANT
SCREW CREO 6.5X50 (Screw) ×1 IMPLANT
SCREW CREO 6.5X60 (Screw) ×1 IMPLANT
SCREW CREO FENESTRATED 7.5X35 (Screw) ×1 IMPLANT
SCREW CREO FENESTRATED 7.5X40 (Screw) ×1 IMPLANT
SCREW CREO MIS 30 TULIP (Screw) ×4 IMPLANT
SCREW LOCKING SD 25 (Screw) ×4 IMPLANT
SPACER SMALL 31X15X25 15D (Spacer) ×1 IMPLANT
SPONGE GAUZE 2X2 8PLY STRL LF (GAUZE/BANDAGES/DRESSINGS) ×1 IMPLANT
SPONGE KITTNER 5P (MISCELLANEOUS) ×5 IMPLANT
SPONGE T-LAP 18X18 ~~LOC~~+RFID (SPONGE) ×4 IMPLANT
STAPLER SKIN PROX 35W (STAPLE) ×4 IMPLANT
SURGIFLO W/THROMBIN 8M KIT (HEMOSTASIS) ×3 IMPLANT
SUT DVC VLOC 3-0 CL 6 P-12 (SUTURE) ×6 IMPLANT
SUT ETHILON 3-0 FS-10 30 BLK (SUTURE)
SUT MNCRL 4-0 (SUTURE) ×3
SUT MNCRL 4-0 27XMFL (SUTURE) ×2
SUT MNCRL AB 3-0 PS2 27 (SUTURE) ×1 IMPLANT
SUT PROLENE 2 0 SH DA (SUTURE) IMPLANT
SUT PROLENE 3 0 SH DA (SUTURE) IMPLANT
SUT PROLENE 5 0 RB 1 DA (SUTURE) ×10 IMPLANT
SUT SILK 2 0 (SUTURE) ×3
SUT SILK 2-0 30XBRD TIE 12 (SUTURE) ×2 IMPLANT
SUT SILK 3 0 REEL (SUTURE) ×3 IMPLANT
SUT VIC AB 0 CT1 27 (SUTURE) ×6
SUT VIC AB 0 CT1 27XCR 8 STRN (SUTURE) ×4 IMPLANT
SUT VIC AB 0 CT1 36 (SUTURE) ×3 IMPLANT
SUT VIC AB 2-0 CT1 (SUTURE) ×2 IMPLANT
SUT VIC AB 2-0 CT1 18 (SUTURE) ×8 IMPLANT
SUT VIC AB 3-0 SH 27 (SUTURE) ×18
SUT VIC AB 3-0 SH 27X BRD (SUTURE) ×4 IMPLANT
SUT VICRYL+ 3-0 36IN CT-1 (SUTURE) ×2 IMPLANT
SUTURE EHLN 3-0 FS-10 30 BLK (SUTURE) IMPLANT
SUTURE MNCRL 4-0 27XMF (SUTURE) ×2 IMPLANT
SYR 10ML LL (SYRINGE) ×3 IMPLANT
SYR 30ML LL (SYRINGE) ×6 IMPLANT
TOWEL OR 17X26 4PK STRL BLUE (TOWEL DISPOSABLE) ×12 IMPLANT
TRAY FOLEY MTR SLVR 16FR STAT (SET/KITS/TRAYS/PACK) ×3 IMPLANT
TROCAR INSERT W/PEDICLE NDL (TROCAR) IMPLANT
TROCAR INSERT W/PEDICLE NDLE (TROCAR)
TUBING CONNECTING 10 (TUBING) ×3 IMPLANT

## 2021-10-26 NOTE — Transfer of Care (Signed)
Immediate Anesthesia Transfer of Care Note ? ?Patient: Shawn Medina ? ?Procedure(s) Performed: L5-S1 ANTERIOR LATERAL INTERBODY LUMBAR FUSION WITH POSTERIOR SPINAL FUSION (Spine Lumbar) ?APPLICATION OF INTRAOPERATIVE CT SCAN (Spine Lumbar) ?ABDOMINAL EXPOSURE (Abdomen) ? ?Patient Location: PACU ? ?Anesthesia Type:General ? ?Level of Consciousness: awake ? ?Airway & Oxygen Therapy: Patient Spontanous Breathing and Patient connected to face mask oxygen ? ?Post-op Assessment: Report given to RN and Post -op Vital signs reviewed and stable ? ?Post vital signs: Reviewed and stable ? ?Last Vitals:  ?Vitals Value Taken Time  ?BP 109/60 10/26/21 1400  ?Temp    ?Pulse 96 10/26/21 1405  ?Resp 24 10/26/21 1405  ?SpO2 100 % 10/26/21 1405  ?Vitals shown include unvalidated device data. ? ?Last Pain:  ?Vitals:  ? 10/26/21 0633  ?TempSrc: Oral  ?PainSc: 4   ?   ? ?  ? ?Complications: No notable events documented. ?

## 2021-10-26 NOTE — Anesthesia Procedure Notes (Addendum)
Procedure Name: Intubation ?Date/Time: 10/26/2021 7:30 AM ?Performed by: Loletha Grayer, CRNA ?Pre-anesthesia Checklist: Patient identified, Patient being monitored, Timeout performed, Emergency Drugs available and Suction available ?Patient Re-evaluated:Patient Re-evaluated prior to induction ?Oxygen Delivery Method: Circle system utilized ?Preoxygenation: Pre-oxygenation with 100% oxygen ?Induction Type: IV induction ?Ventilation: Oral airway inserted - appropriate to patient size and Two handed mask ventilation required ?Laryngoscope Size: McGraph and 4 ?Grade View: Grade I ?Tube type: Oral ?Tube size: 7.5 mm ?Number of attempts: 1 ?Airway Equipment and Method: Stylet ?Placement Confirmation: ETT inserted through vocal cords under direct vision, positive ETCO2 and breath sounds checked- equal and bilateral ?Secured at: 23 cm ?Tube secured with: Tape ?Dental Injury: Teeth and Oropharynx as per pre-operative assessment  ?Comments: Intubated by Timmothy Euler, SRNA. Atraumatic. 2 handed mask ventilation prior due to beard. ? ? ? ? ?

## 2021-10-26 NOTE — Op Note (Signed)
Indications: Shawn Medina is a 31 yo male who presented with: ? ?Pars defect of lumbar spine M43.06, Anterolisthesis M43.10 ? ?He failed conservative management prompting surgical intervention. ? ?Findings: correction of anterolisthesis ? 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 ? ?EBL: 100 ml ?IVF: 1400 ml ?Drains: none ?Disposition: Extubated and Stable to PACU ?Complications: none ? ?A foley catheter was placed. ? ? ?Preoperative Note:  ? ?Risks of surgery discussed include: infection, bleeding, stroke, coma, death, paralysis, CSF leak, nerve/spinal cord injury, numbness, tingling, weakness, complex regional pain syndrome, recurrent stenosis and/or disc herniation, vascular injury, development of instability, neck/back pain, need for further surgery, persistent symptoms, development of deformity, and the risks of anesthesia. The patient understood these risks and agreed to proceed. ? ?Please note that Drs. Dew and Schnier acted as co-surgeons for access to the anterior lumbar spine.  They will dictate a separate note.  ? ?NAME OF ANTERIOR PROCEDURE:               ?1. Anterior lumbar interbody fusion via a retroperitoneal approach at L5/S1 ?2. Placement of a Lordotic Globus Monument interbody cage, filled with BMP ? ?NAME OF POSTERIOR PROCEDURE: ?1. Posterior instrumentation using Globus Creo Instrumentation ?2. Posterolateral fusion, L5/S1  ?3. Use of stereotaxis ([employer]) ? ? ?PROCEDURE:  Patient was brought to the operating room, intubated, turned to the lateral position.  All pressure points were checked and double-checked.  The patient was prepped and draped in the standard fashion.  ? ?Drs. Dew and Schnier scrubbed in for exposure.  After they had adequately exposed the operative site, I scrubbed in and confirmed localization.  We then proceeded to perform discectomy at L5/S1. I utilized a combination of rongeurs, rasp, curettes, and  Kerrison punches to remove the majority of the disc.  We then introduced the trials and sized up to an appropriate size with good grip.  The endplates were prepared for arthrodesis.  We then introduced the Globus Monument interbody device, filled with BMP.  This was secured into position using screws at L5 and S1.  The final tightener was engaged. The reduction mechanism was then used to reduce the spondylolisthesis ? ?Final radiographs were taken to confirm adequate placement of the graft.  Vascular surgery then scrubbed back in for closure. ? ? ? ?After closing the anterior part in layers, the patient was repositioned into prone position.  All pressure points were checked and double-checked. ? ?The posterior operative site was prepped and draped in standard fashion.  The stereotactic array was placed.  Stereotactic images were acquired using intraoperative CT scanning.  This was registered to the patient.  Using stereotaxis, screw trajectories were planned and incisions made.  The pedicles from L5 to S1 were cannulated bilaterally and K wires used to secure the tracks.  We then utilized a stereotactic screwdriver to place pedicle screws from L5 to S1. ? ?At each level, Globus Creo pedicle screws were placed. 7.5x35 at L S1, 7.5x40 at R S1, 6.5x60 at R L5, and 6.5x50 at L L5. ? ?Once the screws were placed, the screw extensions were then linked, a path was formed for the rod and a rod was utilized to connect the screws.  We then compressed, torqued / counter-torqued and removed the screw assembly. Once performed on each side, the C-arm was brought back and to take confirmatory CT scan showing appropriate placement of all instrumentation and anatomic alignment.   ? ?Posterolateral arthrodesis was performed at  L5-S1. ? ?Again we confirmed radiographically and began our closure.  The wound was closed using 0 Vicryl interrupted suture in the fascia, 2-0 Vicryl inverted suture were placed in the subcutaneous tissue and  dermis. 3-0 monocryl was used for final closure. Dermabond was used to close the skin.   ? ?Needle, lap and all counts were correct at the end of the case.   ? ? ?Manning Charity PA assisted in the entire procedure. ? ?Venetia Night MD ?Neurosurgery ? ? ?  ?

## 2021-10-26 NOTE — Plan of Care (Signed)

## 2021-10-26 NOTE — Anesthesia Procedure Notes (Addendum)
Anesthesia Regional Block: TAP block  ? ?Pre-Anesthetic Checklist: , timeout performed,  Correct Patient, Correct Site, Correct Procedure, Correct Position, risks and benefits discussed,  Surgical consent,  Pre-op evaluation,  At surgeon's request and post-op pain management ? ?Laterality: N/A ? ?Prep: chloraprep     ?  ?Needles:  ?Injection technique: Single-shot ? ?Needle Type: Stimiplex   ? ? ? ? ? ? ? ?Additional Needles: ? ? ?Procedures:,,,, ultrasound used (permanent image in chart),,   ?Motor weakness within 20 minutes.  ?Narrative:  ?Start time: 10/26/2021 1:42 PM ?End time: 10/26/2021 1:48 PM ?Injection made incrementally with aspirations every 5 mL. ? ?Performed by: Personally  ?Anesthesiologist: Reed Breech, MD ? ?Additional Notes: ?Functioning IV was confirmed and monitors applied. Ultrasound guidance: relevant anatomy identified, needle position confirmed, local anesthetic spread visualized around nerve(s)., vascular puncture avoided.  Image saved for medical record.  Negative aspiration and no paresthesias; incremental administration of local anesthetic for total 20 ml Exparel (diluted) and 30 ml bupivacaine 0.5% given over four abdominal quadrants. The patient tolerated the procedure well. Vital signs and moderate sedation medications recorded in RN notes. ? ? ? ?

## 2021-10-26 NOTE — Anesthesia Postprocedure Evaluation (Signed)
Anesthesia Post Note ? ?Patient: Shawn Medina ? ?Procedure(s) Performed: L5-S1 ANTERIOR LATERAL INTERBODY LUMBAR FUSION WITH POSTERIOR SPINAL FUSION (Spine Lumbar) ?APPLICATION OF INTRAOPERATIVE CT SCAN (Spine Lumbar) ?ABDOMINAL EXPOSURE (Abdomen) ? ?Patient location during evaluation: PACU ?Anesthesia Type: General ?Level of consciousness: awake and alert, oriented and patient cooperative ?Pain management: pain level controlled ?Vital Signs Assessment: post-procedure vital signs reviewed and stable ?Respiratory status: spontaneous breathing, nonlabored ventilation and respiratory function stable ?Cardiovascular status: blood pressure returned to baseline and stable ?Postop Assessment: adequate PO intake ?Anesthetic complications: no ? ? ?No notable events documented. ? ? ?Last Vitals:  ?Vitals:  ? 10/26/21 1430 10/26/21 1445  ?BP: (!) 90/50 119/73  ?Pulse: 72 86  ?Resp: 15 19  ?Temp:    ?SpO2: 99% 95%  ?  ?Last Pain:  ?Vitals:  ? 10/26/21 1445  ?TempSrc:   ?PainSc: 7   ? ? ?  ?  ?  ?  ?  ?  ? ?Darrin Nipper ? ? ? ? ?

## 2021-10-26 NOTE — Op Note (Signed)
 VEIN AND VASCULAR SURGERY ?  ?  ?OPERATIVE NOTE ?  ?DATE: 10/26/2021 ?  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  ?POST-OPERATIVE DIAGNOSIS: same as above ?  ?PROCEDURE: ?1.   Anterior lumbar interbody fusion exposure via left retroperitoneal approach at L5-S1 ?  ?COSURGEONS: Festus Barren MD and Levora Dredge MD ?  ?ASSISTANT(S): none ?  ?ANESTHESIA: general ?  ?ESTIMATED BLOOD LOSS: 50 cc ?  ?FINDING(S): ?1.  none ?  ?  ?INDICATIONS:   ?Patient presents with need for an anterior spinal L5-S1 interbody fusion.  We are performing the exposure for neurosurgery.  Risks and benefits were discussed and informed consent was obtained. ?  ?DESCRIPTION: ?After obtaining full informed written consent, the patient was brought back to the operating room and placed supine upon the operating table.  The patient was prepped and draped in the standard fashion.  Cosurgeons are used due to the complex nature of the exposure and the adjacent large vascular structures.  ? ?A left paramedian incision was created in the lower abdomen.  Care was taken to stay between the rectus and the obliques.  The fascia was divided in this location.  The peritoneum was retracted medially and several small rents in the peritoneum were closed with Vicryl sutures.  We continued to dissect down to exposing the external iliac artery and vein.  The exposure had to be carried superiorly up to the common iliac artery and vein.  Ureter was identified and protected from harm.  Several small venous branches were ligated and divided between silk ties as needed.  We identified the internal iliac artery and vein as well.  We continued the dissection to the proximal common iliac artery and vein and tediously retracted the artery and vein medially.  This exposed the L5-S1 interbody.  The Omnitract retractor was used as were renal vein retractors and the L5-S1 interbody was exposed to an adequate fashion for fusion  by Dr. Myer Haff. ? ?After Dr. Myer Haff completed his portion of the procedure, we proceeded with closure.  The deep fascia was closed with a running layer of 0 Vicryl.  A more superficial layer was then closed with a running layer of 2-0 Vicryl in the subcutaneous tissue was closed with 3-0 Vicryl.  Skin was coapted with a Monocryl suture.  Sterile dressing was placed. ? ?At this point, we turned the case back over to Dr. Marcell Barlow for the posterior portion of the procedure. ?  ?COMPLICATIONS: None ?  ?CONDITION: Stable ?  ?Renford Dills, MD ?  ?04/28/2020, 3:29 PM ?  ?  ?  ?This note was created with Dragon Medical transcription system. Any errors in dictation are purely unintentional.  ?

## 2021-10-26 NOTE — Anesthesia Preprocedure Evaluation (Addendum)
Anesthesia Evaluation  ?Patient identified by MRN, date of birth, ID band ?Patient awake ? ? ? ?Reviewed: ?Allergy & Precautions, NPO status , Patient's Chart, lab work & pertinent test results ? ?History of Anesthesia Complications ?Negative for: history of anesthetic complications ? ?Airway ?Mallampati: I ? ? ?Neck ROM: Full ? ? ? Dental ?no notable dental hx. ? ?  ?Pulmonary ?sleep apnea ,  ?  ?Pulmonary exam normal ?breath sounds clear to auscultation ? ? ? ? ? ? Cardiovascular ?hypertension, Normal cardiovascular exam ?Rhythm:Regular Rate:Normal ? ?ECG 06/09/21: NSR; possible LA enlargement ? ?Myocardial perfusion 02/23/20:  ?- There was no ST segment deviation noted during stress. ?- The study is normal. ?- This is a low risk study. ?- The left ventricular ejection fraction is normal (55-65%). ?- No evidence of coronary or aortic calcifications on CT images. ?  ?Neuro/Psych ? Headaches, PSYCHIATRIC DISORDERS Anxiety Depression Chronic back pain ?  ? GI/Hepatic ?negative GI ROS,   ?Endo/Other  ?Obesity  ? Renal/GU ?negative Renal ROS  ? ?  ?Musculoskeletal ? ? Abdominal ?  ?Peds ? Hematology ?negative hematology ROS ?(+)   ?Anesthesia Other Findings ? ? Reproductive/Obstetrics ? ?  ? ? ? ? ? ? ? ? ? ? ? ? ? ?  ?  ? ? ? ? ? ? ? ?Anesthesia Physical ?Anesthesia Plan ? ?ASA: 2 ? ?Anesthesia Plan: General and Regional  ? ?Post-op Pain Management: Regional block*  ? ?Induction: Intravenous ? ?PONV Risk Score and Plan: 2 and Ondansetron, Dexamethasone and Treatment may vary due to age or medical condition ? ?Airway Management Planned: Oral ETT ? ?Additional Equipment:  ? ?Intra-op Plan:  ? ?Post-operative Plan: Extubation in OR ? ?Informed Consent: I have reviewed the patients History and Physical, chart, labs and discussed the procedure including the risks, benefits and alternatives for the proposed anesthesia with the patient or authorized representative who has indicated his/her  understanding and acceptance.  ? ? ? ?Dental advisory given ? ?Plan Discussed with: CRNA ? ?Anesthesia Plan Comments: (Plan for GETA and postoperative TAP blocks.  Patient consented for risks of anesthesia including but not limited to:  ?- adverse reactions to medications ?- damage to eyes, teeth, lips or other oral mucosa ?- nerve damage due to positioning  ?- sore throat or hoarseness ?- damage to heart, brain, nerves, lungs, other parts of body or loss of life ? ?Informed patient about role of CRNA in peri- and intra-operative care.  Patient voiced understanding.)  ? ? ? ? ? ?Anesthesia Quick Evaluation ? ?

## 2021-10-26 NOTE — Op Note (Addendum)
[facility] VEIN AND VASCULAR SURGERY ?  ?  ?OPERATIVE NOTE ?  ?DATE: 10/26/2021 ?  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  ?POST-OPERATIVE DIAGNOSIS: same as above ?  ?PROCEDURE: ?1.   Anterior lumbar interbody fusion exposure via left retroperitoneal approach at L5-S1 ?  ?COSURGEONS: Festus Barren MD, Levora Dredge MD ?  ?ASSISTANT(S): none ?  ?ANESTHESIA: general ?  ?ESTIMATED BLOOD LOSS: 50 cc ?  ?FINDING(S): ?1.  none ?  ?  ?INDICATIONS:   ?Patient presents with need for an anterior spinal L5-S1 interbody fusion.  We are performing the exposure for neurosurgery.  Risks and benefits were discussed and informed consent was obtained. Cosurgeons are used due to the complexity of the exposure. ?  ?DESCRIPTION: ?After obtaining full informed written consent, the patient was brought back to the operating room and placed supine upon the operating table.  The patient was prepped and draped in the standard fashion.  Cosurgeons are used due to the complex nature of the exposure and the adjacent large vascular structures. A left paramedian incision was created in the lower abdomen.  Care was taken to stay between the rectus and the obliques.  The fascia was divided in this location.  The peritoneum was retracted medially and several small rents in the peritoneum were closed with Vicryl sutures.  We continued to dissect down to exposing the external iliac artery and vein.  The exposure had to be carried superiorly up to the common iliac artery and vein.  Ureter was identified and protected from harm.  Several small venous branches were ligated and divided between silk ties as needed.  We identified the internal iliac artery and vein as well.  We continued the dissection to the proximal common iliac artery and vein and tediously retracted the artery and vein medially.  This exposed the L5-S1 interbody.  The Omnitract retractor was used as were renal vein retractors and the L5-S1  interbody was exposed to an adequate fashion for fusion by Dr. Myer Haff. ?After Dr. Myer Haff completed his portion of the procedure, we proceeded with closure.  The deep fascia was closed with a running layer of 0 Vicryl.  A more superficial layer was then closed with a running layer of 2-0 Vicryl in the subcutaneous tissue was closed with 3-0 Vicryl.  Skin was coapted with staples.  Sterile dressing was placed. ?At this point, we turned the case back over to Dr. Marcell Barlow for the posterior portion of the procedure. ?  ?COMPLICATIONS: None ?  ?CONDITION: Stable ?  ?Festus Barren ?  ?04/28/2020, 3:29 PM ?  ?  ?  ?This note was created with Dragon Medical transcription system. Any errors in dictation are purely unintentional.  ?

## 2021-10-26 NOTE — H&P (Signed)
History of Present Illness: ?10/26/2021 ?[name] presents today with continued symptoms. ? ?09/15/2021 ?[name] is here today with a chief complaint of low back pain that radiates into the bilateral legs with intermittent numbness and tingling in the legs. ? ?He has been having problems for 3 years since an injury. He reports sharp stabbing burning and aching pain in his legs. His pain is as bad as 10 out of 10 and made worse by walking, standing, lifting, twisting, bending. Nothing really helps. Sitting down helps a small amount. He is done more than 1 course of physical therapy without improvement. His pain is been worsening over time. ? ?Bowel/Bladder Dysfunction: none ? ?Conservative measures:  ?Participated in acupuncture ?Physical therapy: has participated in at [facility] ?Multimodal medical therapy including regular antiinflammatories: gabapentin, ibuprofen, meloxicam ?Injections: has had epidural steroid injections ? ?Past Surgery: none ? ?Shawn Medina has no symptoms of cervical myelopathy. ? ?The symptoms are causing a significant impact on the patient's life.  ? ?Review of Systems:  ?A 10 point review of systems is negative, except for the pertinent positives and negatives detailed in the HPI. ? ?Past Medical History: ?Past Medical History:  ?Diagnosis Date  ? Depression  ? Hyperlipidemia  ? Hypertension  ? Migraines  ? Sleep apnea  ? ?Past Surgical History: ?No past surgical history on file. ? ?Allergies  ?Allergen Reactions  ? Soy Allergy Shortness Of Breath  ? Oxycodone Hcl Nausea And Vomiting  ?  Felt like he was going to die  ? ? ?Current Facility-Administered Medications for the 10/26/21 encounter [facility] Encounter)  ?Medication  ? betamethasone acetate-betamethasone sodium phosphate (CELESTONE) injection 3 mg  ? ?Current Meds  ?Medication Sig  ? atorvastatin (LIPITOR) 80 MG tablet Take 40 mg by mouth at bedtime.  ? gabapentin (NEURONTIN) 300 MG capsule Take 300 mg by mouth daily as  needed (back pain).  ? ibuprofen (ADVIL) 600 MG tablet Take 600 mg by mouth 3 (three) times daily as needed.  ? ondansetron (ZOFRAN-ODT) 4 MG disintegrating tablet Take 1 tablet (4 mg total) by mouth every 6 (six) hours as needed for nausea or vomiting.  ? verapamil (CALAN-SR) 120 MG CR tablet Take 120 mg by mouth at bedtime.  ? [DISCONTINUED] ibuprofen (ADVIL) 800 MG tablet Take 1 tablet (800 mg total) by mouth every 8 (eight) hours as needed for mild pain.  ? [DISCONTINUED] lidocaine (XYLOCAINE) 5 % ointment Apply 1 application. topically 3 (three) times daily as needed for moderate pain.  ? ? ? ?Social History: ?Social History  ? ?Tobacco Use  ? Smoking status: Never  ? ?Family Medical History: ?No family history on file. ? ?Physical Examination: ? ?Vitals:  ? 10/26/21 4650  ?BP: 133/73  ?Pulse: 68  ?Resp: 16  ?Temp: 97.6 ?F (36.4 ?C)  ?SpO2: 99%  ? ?Heart sounds normal no MRG. Chest Clear to Auscultation Bilaterally. ? ? ?General: Patient is well developed, well nourished, calm, collected, and in no apparent distress. Attention to examination is appropriate. ? ?Psychiatric: Patient is non-anxious. ? ?Head: Pupils equal, round, and reactive to light. ? ?ENT: Oral mucosa appears well hydrated. ? ?Neck: Supple. Full range of motion. ? ?Respiratory: Patient is breathing without any difficulty. ? ?Extremities: No edema. ? ?Vascular: Palpable dorsal pedal pulses. ? ?Skin: On exposed skin, there are no abnormal skin lesions. ? ?NEUROLOGICAL:  ? ?Awake, alert, oriented to person, place, and time. Speech is clear and fluent. Fund of knowledge is appropriate.  ? ?Cranial Nerves: Pupils  equal round and reactive to light. Facial tone is symmetric. Facial sensation is symmetric. Shoulder shrug is symmetric. Tongue protrusion is midline. There is no pronator drift. ? ?Strength: ?Side Biceps Triceps Deltoid Interossei Grip Wrist Ext. Wrist Flex.  ?R 5 5 5 5 5 5 5   ?L 5 5 5 5 5 5 5   ? ?Side Iliopsoas Quads Hamstring PF DF EHL   ?R 5 5 5 5 5 5   ?L 5 5 5 5 5 5   ? ?Reflexes are 1+ and symmetric at the biceps, triceps, brachioradialis, patella and achilles. Hoffman's is absent.  ?Clonus is not present. Toes are down-going.  ?Bilateral upper and lower extremity sensation is intact to light touch.  ?No evidence of dysmetria noted. ? ?Gait is normal.  ? ?Medical Decision Making ? ?Imaging: ?MRI L spine 06/10/2022 ?IMPRESSION:  ?1. No acute abnormality within the lumbar spine. No evidence for  ?cord compression.  ?2. 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  ?3. Moderate left greater than right facet hypertrophy at L4-5 with  ?mild left lateral recess stenosis.  ?4. Small left paracentral disc protrusion at T11-12 without  ?stenosis.  ? ?Electronically Signed  ?  By: M.D.  ?  On: 06/10/2021 02:47 ? ?On flexion-extension x-rays on September 15, 2021, he has 11 mm of anterolisthesis on extension that increases to 15 mm on flexion. ? ?I have personally reviewed the images and agree with the above interpretation. ? ?Assessment and Plan: ?Mr. 14/03/2022 is a pleasant 31 y.o. male with pars defect at L5 with anterolisthesis of L5 on S1 that is mobile. He has compression of his L5 nerve roots in the foramina. He has failed conservative management.  We will proceed with L5-S1 ALIF followed by posterior spinal fusion.  ? ? ?14/03/2021 MD, MPHS ?Department of Neurosurgery ?

## 2021-10-27 ENCOUNTER — Encounter: Payer: Self-pay | Admitting: Neurosurgery

## 2021-10-27 LAB — CBC
HCT: 37.7 % — ABNORMAL LOW (ref 39.0–52.0)
Hemoglobin: 13.3 g/dL (ref 13.0–17.0)
MCH: 30.6 pg (ref 26.0–34.0)
MCHC: 35.3 g/dL (ref 30.0–36.0)
MCV: 86.9 fL (ref 80.0–100.0)
Platelets: 203 10*3/uL (ref 150–400)
RBC: 4.34 MIL/uL (ref 4.22–5.81)
RDW: 11.9 % (ref 11.5–15.5)
WBC: 15.8 10*3/uL — ABNORMAL HIGH (ref 4.0–10.5)
nRBC: 0 % (ref 0.0–0.2)

## 2021-10-27 LAB — CREATININE, SERUM
Creatinine, Ser: 0.92 mg/dL (ref 0.61–1.24)
GFR, Estimated: >60 mL/min (ref >60)

## 2021-10-27 MED ORDER — GABAPENTIN 300 MG PO CAPS
300.0000 mg | ORAL_CAPSULE | Freq: Three times a day (TID) | ORAL | Status: DC
Start: 2021-10-27 — End: 2021-10-30
  Administered 2021-10-27 – 2021-10-30 (×10): 300 mg via ORAL
  Filled 2021-10-27 (×10): qty 1

## 2021-10-27 MED ORDER — SODIUM CHLORIDE 0.9 % IV BOLUS
1000.0000 mL | Freq: Once | INTRAVENOUS | Status: AC
  Administered 2021-10-27: 1000 mL via INTRAVENOUS

## 2021-10-27 NOTE — Evaluation (Signed)
Occupational Therapy Evaluation ?Patient Details ?Name: Shawn Medina ?MRN: [id number] ?DOB: 04/05/1991 ?Today's Date: 10/27/2021 ? ? ?History of Present Illness Pt is a 31 yo male s/p L5-S1 ALIF. PMH of sleep apnea, HTN, anxiety, depression.  ? ?Clinical Impression ?  ?Shawn Medina was seen for OT evaluation this date. Prior to hospital admission, pt was Independent for mobility and ADLs including working and driving. Pt lives with spouse and 2 children (5 + 299 yo) in home c 2 [address], parents live nearby. Pt presents to acute OT demonstrating impaired ADL performance and functional mobility 2/2 decreased activity tolerance, pain, and functional ROM deficits. Pt currently requires MIN A don shorts at bed level - assist to thread over BLE, bed rail use for rolling. Instructed on log rolling for back pcns - MIN A to achieve. Pt reports 10/10 pain t/o session, RN in to administer pain meds. SUPERVISION seated grooming tasks. CGA + RW sit<>stand x2, limited ~30 sec each standing trial by + orthostatics. Pt would benefit from skilled OT to address noted impairments and functional limitations (see below for any additional details). Upon hospital discharge, recommend HHOT to maximize pt safety and return to PLOF. ?  ? ?SUPINE: BP 105/62, MAP 76 ?SITTING: BP 96/83, MAP 89, +dizzy resolved with time ?STANDING: BP 51/41, MAP 47, HR 90, + dizzy, lightheaded, pale colouring, B hands tingling ?   ? ?Recommendations for follow up therapy are one component of a multi-disciplinary discharge planning process, led by the attending physician.  Recommendations may be updated based on patient status, additional functional criteria and insurance authorization.  ? ?Follow Up Recommendations ? Home health OT  ?  ?Assistance Recommended at Discharge Intermittent Supervision/Assistance  ?Patient can return home with the following A little help with walking and/or transfers;A little help with bathing/dressing/bathroom;Help with stairs or ramp for  entrance ? ?  ?Functional Status Assessment ? Patient has had a recent decline in their functional status and demonstrates the ability to make significant improvements in function in a reasonable and predictable amount of time.  ?Equipment Recommendations ? BSC/3in1  ?  ?Recommendations for Other Services   ? ? ?  ?Precautions / Restrictions Precautions ?Precautions: Back;Fall ?Restrictions ?Weight Bearing Restrictions: No  ? ?  ? ?Mobility Bed Mobility ?Overal bed mobility: Needs Assistance ?Bed Mobility: Rolling, Sidelying to Sit, Sit to Sidelying ?Rolling: Min assist ?Sidelying to sit: Min guard ?  ?  ?Sit to sidelying: Min assist ?General bed mobility comments: assist 2/2 pain and orthostatics for log roll at flat bed ?  ? ?Transfers ?Overall transfer level: Needs assistance ?Equipment used: Rolling walker (2 wheels) ?Transfers: Sit to/from Stand ?Sit to Stand: Min guard ?  ?  ?  ?  ?  ?General transfer comment: MIN A without RW use, improved to CGA ?  ? ?  ?Balance Overall balance assessment: Needs assistance ?Sitting-balance support: No upper extremity supported, Feet supported ?Sitting balance-Leahy Scale: Good ?  ?  ?Standing balance support: Bilateral upper extremity supported ?Standing balance-Leahy Scale: Fair ?  ?  ?  ?  ?  ?  ?  ?  ?  ?  ?  ?  ?   ? ?ADL either performed or assessed with clinical judgement  ? ?ADL Overall ADL's : Needs assistance/impaired ?  ?  ?  ?  ?  ?  ?  ?  ?  ?  ?  ?  ?  ?  ?  ?  ?  ?  ?  ?  General ADL Comments: MIN A don shorts at bed level - assist to thread over BLE, bed rail use for rolling. CGA + RW for simulated BSC t/f - limited by orthostatics. SUPERVISION seated grooming tasks  ? ? ? ? ?Pertinent Vitals/Pain Pain Assessment ?Pain Assessment: 0-10 ?Pain Score: 10-Worst pain ever ?Pain Location: back ?Pain Descriptors / Indicators: Pressure, Penetrating ?Pain Intervention(s): Limited activity within patient's tolerance, RN gave pain meds during session  ? ? ? ?Hand Dominance  Right ?  ?Extremity/Trunk Assessment Upper Extremity Assessment ?Upper Extremity Assessment: Overall WFL for tasks assessed ?  ?Lower Extremity Assessment ?Lower Extremity Assessment: Overall WFL for tasks assessed ?  ?Cervical / Trunk Assessment ?Cervical / Trunk Assessment: Back Surgery ?  ?Communication Communication ?Communication: No difficulties ?  ?Cognition Arousal/Alertness: Awake/alert ?Behavior During Therapy: [facility] for tasks assessed/performed ?Overall Cognitive Status: Within Functional Limits for tasks assessed ?  ?  ?  ?  ?  ?  ?  ?  ?  ?  ?  ?  ?  ?  ?  ?  ?  ?  ?  ?General Comments  SUPINE: BP 105/62, MAP 76. SITTING: BP 96/83, MAP 89. STANDING: BP 51/41, MAP 47, HR 90 ? ?  ?   ?   ? ? ?Home Living Family/patient expects to be discharged to:: Private residence ?Living Arrangements: Spouse/significant other;Children ?Available Help at Discharge: Family;Available 24 hours/day ?Type of Home: House ?Home Access: Stairs to enter ?Entrance Stairs-Number of Steps: 2 ?Entrance Stairs-Rails: Can reach both ?Home Layout: One level ?  ?  ?Bathroom Shower/Tub: Tub/shower unit ?  ?  ?  ?  ?Home Equipment: Grab bars - tub/shower ?  ?Additional Comments: parents live next door, spouse cares for children/farm animals at home (5yo, [place]) ?  ? ?  ?Prior Functioning/Environment Prior Level of Function : Independent/Modified Independent;Working/employed;Driving ?  ?  ?  ?  ?  ?  ?  ?ADLs Comments: works as Curator ?  ? ?  ?  ?OT Problem List: Decreased strength;Decreased range of motion;Decreased activity tolerance;Impaired balance (sitting and/or standing) ?  ?   ?OT Treatment/Interventions: Self-care/ADL training;Therapeutic exercise;Energy conservation;DME and/or AE instruction;Therapeutic activities;Balance training;Patient/family education  ?  ?OT Goals(Current goals can be found in the care plan section) Acute Rehab OT Goals ?Patient Stated Goal: to improve pain ?OT Goal Formulation: With patient/family ?Time For  Goal Achievement: 11/10/21 ?Potential to Achieve Goals: Good ?ADL Goals ?Pt Will Perform Grooming: Independently;standing ?Pt Will Perform Lower Body Dressing: Independently;bed level ?Pt Will Transfer to Toilet: ambulating;regular height toilet;with modified independence  ?OT Frequency: Min 2X/week ?  ? ?Co-evaluation   ?  ?  ?  ?  ? ?  ?AM-PAC OT "6 Clicks" Daily Activity     ?Outcome Measure Help from another person eating meals?: None ?Help from another person taking care of personal grooming?: A Little ?Help from another person toileting, which includes using toliet, bedpan, or urinal?: A Little ?Help from another person bathing (including washing, rinsing, drying)?: A Little ?Help from another person to put on and taking off regular upper body clothing?: None ?Help from another person to put on and taking off regular lower body clothing?: A Little ?6 Click Score: 20 ?  ?End of Session Equipment Utilized During Treatment: Rolling walker (2 wheels) ?Nurse Communication: Mobility status ? ?Activity Tolerance: Patient tolerated treatment well;Treatment limited secondary to medical complications (Comment) ?Patient left: in bed;with call bell/phone within reach;with family/visitor present ? ?OT Visit Diagnosis: Other abnormalities of gait and  mobility (R26.89);Muscle weakness (generalized) (M62.81)  ?              ?Time: 6195-0932 ?OT Time Calculation (min): 34 min ?Charges:  OT General Charges ?$OT Visit: 1 Visit ?OT Evaluation ?$OT Eval Moderate Complexity: 1 Mod ?OT Treatments ?$Self Care/Home Management : 23-37 mins ? ?Kathie Dike, M.S. OTR/L  ?10/27/21, 10:48 AM  ?ascom [phone] ? ?

## 2021-10-27 NOTE — Evaluation (Addendum)
Physical Therapy Evaluation ?Patient Details ?Name: Shawn Medina ?MRN: [id number] ?DOB: 1991-03-17 ?Today's Date: 10/27/2021 ? ?History of Present Illness ? Pt is a 31 yo male s/p L5-S1 ALIF. PMH of sleep apnea, HTN, anxiety, depression.  ?Clinical Impression ? Pt A&Ox4, reported 10/10 pain with sitting 8/10 LBP with ambulation. 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 ? ?The patient had great recall of log roll technique, minA to assist with trunk elevation due to pain. Sit <> stand with RW and CGA twice, education on hand placement. He was able to progress to ambulating ~231ft with RW and supervision. Improving gait quality, BLE weight bearing, and cadence noted with time. Overall the patient demonstrated deficits (see "PT Problem List") that impede the patient's functional abilities, safety, and mobility and would benefit from skilled PT intervention. Recommendation is outpatient PT to return to PLOF.  ? ?Of note pt BP measured in sitting, standing, and standing 3 minutes, WFLs. Pt did endorse it was still a low reading for him, but no orthostatic symptoms.  ?   ?   ? ?Recommendations for follow up therapy are one component of a multi-disciplinary discharge planning process, led by the attending physician.  Recommendations may be updated based on patient status, additional functional criteria and insurance authorization. ? ?Follow Up Recommendations Outpatient PT ? ?  ?Assistance Recommended at Discharge Intermittent Supervision/Assistance  ?Patient can return home with the following ? A little help with walking and/or transfers;A little help with bathing/dressing/bathroom;Assistance with cooking/housework;Assist for transportation;Help with stairs or ramp for entrance;Direct supervision/assist for medications management ? ?  ?Equipment Recommendations Rolling walker (2 wheels)  ?Recommendations for Other Services ?    ?  ?Functional  Status Assessment Patient has had a recent decline in their functional status and demonstrates the ability to make significant improvements in function in a reasonable and predictable amount of time.  ? ?  ?Precautions / Restrictions Precautions ?Precautions: Back;Fall ?Precaution Booklet Issued: No ?Restrictions ?Weight Bearing Restrictions: No  ? ?  ? ?Mobility ? Bed Mobility ?Overal bed mobility: Needs Assistance ?Bed Mobility: Rolling, Sidelying to Sit ?Rolling: Supervision ?Sidelying to sit: Min assist ?  ?  ?  ?General bed mobility comments: minA for trunk elevation ?  ? ?Transfers ?Overall transfer level: Needs assistance ?Equipment used: Rolling walker (2 wheels) ?Transfers: Sit to/from Stand ?Sit to Stand: Min guard, Supervision ?  ?  ?  ?  ?  ?  ?  ? ?Ambulation/Gait ?Ambulation/Gait assistance: Supervision ?Gait Distance (Feet): 200 Feet ?Assistive device: Rolling walker (2 wheels) ?  ?  ?  ?  ?General Gait Details: improved gait velocity and quality over time, able to decrease weight bearing in BUE ? ?Stairs ?  ?  ?  ?  ?  ? ?Wheelchair Mobility ?  ? ?Modified Rankin (Stroke Patients Only) ?  ? ?  ? ?Balance Overall balance assessment: Needs assistance ?Sitting-balance support: No upper extremity supported, Feet supported ?Sitting balance-Leahy Scale: Good ?  ?  ?Standing balance support: Bilateral upper extremity supported ?Standing balance-Leahy Scale: Fair ?Standing balance comment: preferred BUE support ?  ?  ?  ?  ?  ?  ?  ?  ?  ?  ?  ?   ? ? ? ?Pertinent Vitals/Pain Pain Assessment ?Pain Assessment: 0-10 ?Pain Score: 8  ?Pain Location: 10/10 initially, 8/10 with walking ?Pain Descriptors / Indicators: Pressure, Penetrating, Aching, Sore, Heaviness ?Pain Intervention(s): Limited  activity within patient's tolerance, Monitored during session, Repositioned, Patient requesting pain meds-RN notified  ? ? ?Home Living Family/patient expects to be discharged to:: Private residence ?Living Arrangements:  Spouse/significant other;Children ?Available Help at Discharge: Family;Available 24 hours/day ?Type of Home: House ?Home Access: Stairs to enter ?Entrance Stairs-Rails: Can reach both ?Entrance Stairs-Number of Steps: 2 ?  ?Home Layout: One level ?Home Equipment: Grab bars - tub/shower ?Additional Comments: parents live next door, spouse cares for children/farm animals at home (5yo, [place])  ?  ?Prior Function Prior Level of Function : Independent/Modified Independent;Working/employed;Driving ?  ?  ?  ?  ?  ?  ?  ?ADLs Comments: works as Curator ?  ? ? ?Hand Dominance  ? Dominant Hand: Right ? ?  ?Extremity/Trunk Assessment  ? Upper Extremity Assessment ?Upper Extremity Assessment: Overall WFL for tasks assessed ?  ? ?Lower Extremity Assessment ?Lower Extremity Assessment:  (able to move against gravity, ankle Pf/DF 5/5, light touch sensation intact bilaterally) ?  ? ?Cervical / Trunk Assessment ?Cervical / Trunk Assessment: Back Surgery  ?Communication  ? Communication: No difficulties  ?Cognition Arousal/Alertness: Awake/alert ?Behavior During Therapy: [facility] for tasks assessed/performed ?Overall Cognitive Status: Within Functional Limits for tasks assessed ?  ?  ?  ?  ?  ?  ?  ?  ?  ?  ?  ?  ?  ?  ?  ?  ?  ?  ?  ? ?  ?General Comments   ? ?  ?Exercises    ? ?Assessment/Plan  ?  ?PT Assessment Patient needs continued PT services  ?PT Problem List Decreased strength;Decreased mobility;Decreased range of motion;Decreased activity tolerance;Decreased balance;Pain;Decreased knowledge of use of DME;Decreased knowledge of precautions ? ?   ?  ?PT Treatment Interventions DME instruction;Therapeutic exercise;Gait training;Balance training;Stair training;Neuromuscular re-education;Functional mobility training;Therapeutic activities;Patient/family education   ? ?PT Goals (Current goals can be found in the Care Plan section)  ?Acute Rehab PT Goals ?Patient Stated Goal: to have less pain ?PT Goal Formulation: With patient ?Time  For Goal Achievement: 11/10/21 ?Potential to Achieve Goals: Good ? ?  ?Frequency 7X/week ?  ? ? ?Co-evaluation   ?  ?  ?  ?  ? ? ?  ?AM-PAC PT "6 Clicks" Mobility  ?Outcome Measure Help needed turning from your back to your side while in a flat bed without using bedrails?: A Little ?Help needed moving from lying on your back to sitting on the side of a flat bed without using bedrails?: A Little ?Help needed moving to and from a bed to a chair (including a wheelchair)?: A Little ?Help needed standing up from a chair using your arms (e.g., wheelchair or bedside chair)?: None ?Help needed to walk in hospital room?: None ?Help needed climbing 3-5 steps with a railing? : A Little ?6 Click Score: 20 ? ?  ?End of Session Equipment Utilized During Treatment: Gait belt ?Activity Tolerance: Patient tolerated treatment well ?Patient left: in chair;with call bell/phone within reach ?Nurse Communication: Mobility status ?PT Visit Diagnosis: Other abnormalities of gait and mobility (R26.89);Difficulty in walking, not elsewhere classified (R26.2);Muscle weakness (generalized) (M62.81);Pain ?Pain - Right/Left:  (midline) ?Pain - part of body:  (back) ?  ? ?Time: 4166-0630 ?PT Time Calculation (min) (ACUTE ONLY): 36 min ? ? ?Charges:   PT Evaluation ?$PT Eval Low Complexity: 1 Low ?PT Treatments ?$Therapeutic Activity: 23-37 mins ?  ?   ? ?Olga Coaster PT, DPT ?2:44 PM,10/27/21 ? ? ?

## 2021-10-27 NOTE — Progress Notes (Signed)
PT Cancellation Note ? ?Patient Details ?Name: Shawn Medina ?MRN: [id number] ?DOB: 1990/08/21 ? ? ?Cancelled Treatment:    Reason Eval/Treat Not Completed: Other (comment). Pt with low BP with OT evaluation, PT to re-attempt at a later time.  ? ?Olga Coaster PT, DPT ?10:24 AM,10/27/21 ? ?

## 2021-10-27 NOTE — Progress Notes (Signed)
? ?   Attending Progress Note ? ?History: Shawn Medina is here for pars defect and spondylolisthesis. ? ?POD1: Passing gas.  Having significant back pain. ? ?Physical Exam: ?Vitals:  ? 10/26/21 2118 10/27/21 0519  ?BP: 130/71 122/69  ?Pulse: 94 84  ?Resp: 20 20  ?Temp: 98.3 ?F (36.8 ?C) 99 ?F (37.2 ?C)  ?SpO2: 99% 98%  ? ? ?AA Ox3 ?CNI ? ?Strength:5/5 throughout BLE with back pain ?Incisions c/d/i ? ?Data: ? ?Recent Labs  ?Lab 10/27/21 ?9767  ?CREATININE 0.92  ? ?No results for input(s): AST, ALT, ALKPHOS in the last 168 hours. ? ?Invalid input(s): TBILI  ? Recent Labs  ?Lab 10/27/21 ?3419  ?WBC 15.8*  ?HGB 13.3  ?HCT 37.7*  ?PLT 203  ? ?No results for input(s): APTT, INR in the last 168 hours.  ?   ? ? ?Other tests/results: n/a ? ?Assessment/Plan: ? ?Shawn Medina is doing as expected after L5-S1 ALIF. ? ?- mobilize ?- pain control ?- DVT prophylaxis ?- PTOT ?- slowly advance diet ? ? ?Venetia Night MD, MPHS ?Department of Neurosurgery ? ? ? ?

## 2021-10-28 LAB — BPAM RBC
Blood Product Expiration Date: 202305122359
Unit Type and Rh: 7300

## 2021-10-28 LAB — TYPE AND SCREEN
ABO/RH(D): B POS
Antibody Screen: NEGATIVE
Unit division: 0

## 2021-10-28 LAB — PREPARE RBC (CROSSMATCH)

## 2021-10-28 MED ORDER — LACTULOSE 10 GM/15ML PO SOLN
20.0000 g | Freq: Once | ORAL | Status: AC
  Administered 2021-10-28: 20 g via ORAL
  Filled 2021-10-28: qty 30

## 2021-10-28 MED ORDER — MINERAL OIL RE ENEM
1.0000 | ENEMA | Freq: Once | RECTAL | Status: AC
  Administered 2021-10-28: 1 via RECTAL

## 2021-10-28 MED ORDER — CELECOXIB 200 MG PO CAPS
200.0000 mg | ORAL_CAPSULE | Freq: Two times a day (BID) | ORAL | Status: DC
  Administered 2021-10-28 – 2021-10-30 (×5): 200 mg via ORAL
  Filled 2021-10-28 (×5): qty 1

## 2021-10-28 NOTE — Plan of Care (Signed)

## 2021-10-28 NOTE — Discharge Instructions (Addendum)
NEUROSURGERY DISCHARGE INSTRUCTIONS ? ?Admission diagnosis: S/P lumbar fusion [Z98.1] ? ?Operative procedure: L5-S1 ALIF ? ?What to do after you leave the hospital: ? ?Recommended diet: regular diet. Increase protein intake to promote wound healing. ? ?Recommended activity: no lifting, driving, or strenuous exercise for 4 weeks . You should walk multiple times per day ? ?Special Instructions ? ?No straining, no heavy lifting > 10lbs x 4 weeks.  Keep incision area clean and dry. May shower in 2 days. No baths or pools for 6 weeks.  ?Please remove dressing tomorrow, no need to apply a bandage afterwards ? ?You have no sutures to remove, the skin is closed with adhesive ? ?Please take pain medications as directed. Take a stool softener if on pain medications ? ? ?Please Report any of the following: ?Nausea or Vomiting, Temperature is greater than 101.5F (38.1C) degrees, Dizziness, Abdominal Pain, Difficulty Breathing or Shortness of Breath, Inability to Eat, drink Fluids, or Take medications, Bleeding, swelling, or drainage from surgical incision sites, New numbness or weakness, and Bowel or bladder dysfunction to the neurosurgeon on call at [phone] ? ?Additional Follow up appointments ?Please follow up with Sammantha Mehlhaff PA-C in [facility] as scheduled in 2-3 weeks ? ? ?Please see below for scheduled appointments: ? ?No future appointments. ? ?  ?

## 2021-10-28 NOTE — Progress Notes (Signed)
Occupational Therapy Treatment ?Patient Details ?Name: Shawn Medina ?MRN: [id number] ?DOB: [birth year] ?Today's Date: 10/28/2021 ? ? ?History of present illness Pt is a 31 yo male s/p L5-S1 ALIF. PMH of sleep apnea, HTN, anxiety, depression. ?  ?OT comments ? Upon entering the room, pt in flat in bed with 8/10 c/o pain in back reported with OT calling for pain medication. Pt is agreeable to participate but reporting weakness, pressure, and spasms during functional task. He is very motivated to return home. Back precautions reviewed again and pt performing supine >sit without physical assistance. Pt stands with increased effort and supervision. He is able to ambulate 15' with RW before needing to rest secondary to increase pain. No c/o dizziness this session but does report hand numbness at end of session.  ? ?Orthostatics: ?Supine: 115/63 ?Sitting EOB: 128/80 ?After ambulation: 120/80  ? ?Recommendations for follow up therapy are one component of a multi-disciplinary discharge planning process, led by the attending physician.  Recommendations may be updated based on patient status, additional functional criteria and insurance authorization. ?   ?Follow Up Recommendations ? Home health OT  ?  ?Assistance Recommended at Discharge Intermittent Supervision/Assistance  ?Patient can return home with the following ? A little help with walking and/or transfers;A little help with bathing/dressing/bathroom;Help with stairs or ramp for entrance ?  ?Equipment Recommendations ? BSC/3in1  ?  ?   ?Precautions / Restrictions Precautions ?Precautions: Back;Fall ?Restrictions ?Weight Bearing Restrictions: No  ? ? ?  ? ?Mobility Bed Mobility ?Overal bed mobility: Needs Assistance ?Bed Mobility: Rolling, Sidelying to Sit ?Rolling: Supervision ?Sidelying to sit: Min guard ?  ?  ?  ?General bed mobility comments: cuing for technique ?  ? ?Transfers ?Overall transfer level: Needs assistance ?Equipment used: Rolling walker (2  wheels) ?Transfers: Sit to/from Stand ?Sit to Stand: Supervision ?  ?  ?  ?  ?  ?  ?  ?  ?Balance Overall balance assessment: Needs assistance ?Sitting-balance support: No upper extremity supported, Feet supported ?Sitting balance-Leahy Scale: Good ?  ?  ?Standing balance support: Bilateral upper extremity supported ?Standing balance-Leahy Scale: Fair ?Standing balance comment: preferred BUE support ?  ?  ?  ?  ?  ?  ?  ?  ?  ?  ?  ?   ? ?ADL either performed or assessed with clinical judgement  ? ? ?Extremity/Trunk Assessment Upper Extremity Assessment ?Upper Extremity Assessment: Overall WFL for tasks assessed ?  ?  ?  ?  ?  ? ?Vision Patient Visual Report: No change from baseline ?  ?  ?   ?   ? ?Cognition Arousal/Alertness: Awake/alert ?Behavior During Therapy: [facility] for tasks assessed/performed ?Overall Cognitive Status: Within Functional Limits for tasks assessed ?  ?  ?  ?  ?  ?  ?  ?  ?  ?  ?  ?  ?  ?  ?  ?  ?  ?  ?  ?   ?   ?   ?   ? ? ?Pertinent Vitals/ Pain       Pain Assessment ?Pain Assessment: 0-10 ?Pain Score: 8  ?Pain Location: back ?Pain Descriptors / Indicators: Pressure, Aching, Sore, Heaviness ?Pain Intervention(s): Limited activity within patient's tolerance, Monitored during session, Repositioned, RN gave pain meds during session ? ?   ?   ? ?Frequency ? Min 2X/week  ? ? ? ? ?  ?Progress Toward Goals ? ?OT Goals(current goals can now be found in the care plan  section) ? Progress towards OT goals: Progressing toward goals ? ?Acute Rehab OT Goals ?Patient Stated Goal: to decrease pain and go home ?OT Goal Formulation: With patient/family ?Time For Goal Achievement: 11/10/21 ?Potential to Achieve Goals: Good  ?Plan Discharge plan remains appropriate;Frequency remains appropriate   ? ?   ?AM-PAC OT "6 Clicks" Daily Activity     ?Outcome Measure ? ? Help from another person eating meals?: None ?Help from another person taking care of personal grooming?: A Little ?Help from another person toileting,  which includes using toliet, bedpan, or urinal?: A Little ?Help from another person bathing (including washing, rinsing, drying)?: A Little ?Help from another person to put on and taking off regular upper body clothing?: None ?Help from another person to put on and taking off regular lower body clothing?: A Little ?6 Click Score: 20 ? ?  ?End of Session Equipment Utilized During Treatment: Rolling walker (2 wheels) ? ?OT Visit Diagnosis: Other abnormalities of gait and mobility (R26.89);Muscle weakness (generalized) (M62.81) ?  ?Activity Tolerance Patient limited by pain ?  ?Patient Left with call bell/phone within reach;in chair ?  ?Nurse Communication Mobility status;Patient requests pain meds ?  ? ?   ? ?Time: 2633-3545 ?OT Time Calculation (min): 19 min ? ?Charges: OT General Charges ?$OT Visit: 1 Visit ?OT Treatments ?$Therapeutic Activity: 8-22 mins ? ?Jackquline Denmark, MS, OTR/L , CBIS ?ascom [phone]  ?10/28/21, 1:53 PM  ?

## 2021-10-28 NOTE — Progress Notes (Signed)
? ?   Attending Progress Note ? ?History: Shawn Medina is here for pars defect and spondylolisthesis. ? ?POD2: continues to pass gas. Unfortunately no BM yet. Back pain improved from yesterday ? ?POD1: Passing gas.  Having significant back pain. ? ?Physical Exam: ?Vitals:  ? 10/28/21 0624 10/28/21 0839  ?BP:  127/76  ?Pulse:  85  ?Resp:  18  ?Temp: 99.2 ?F (37.3 ?C) 98.6 ?F (37 ?C)  ?SpO2:  97%  ? ? ?AA Ox3 ?CNI ? ?Strength:5/5 throughout BLE  ?Incisions c/d/i ? ?Data: ? ?Recent Labs  ?Lab 10/27/21 ?2119  ?CREATININE 0.92  ? ? ?No results for input(s): AST, ALT, ALKPHOS in the last 168 hours. ? ?Invalid input(s): TBILI  ? Recent Labs  ?Lab 10/27/21 ?4174  ?WBC 15.8*  ?HGB 13.3  ?HCT 37.7*  ?PLT 203  ? ? ?No results for input(s): APTT, INR in the last 168 hours.  ?   ? ? ?Other tests/results: n/a ? ?Assessment/Plan: ? ?Shawn Medina is doing as expected after L5-S1 ALIF. ? ?- mobilize ?- pain control ?- DVT prophylaxis ?- PTOT ?- will escalate bowel regimen ? ?Manning Charity PA-C ?Department of Neurosurgery ? ? ? ?

## 2021-10-28 NOTE — Progress Notes (Signed)
Met with the patient at the bedside ?He lives at home wit his wife and 2 minor children, he has transportation with his wife, ?He will need a RW, ?[employer] to deliver to the bedside ?[facility] to be [facility] up ?

## 2021-10-28 NOTE — Progress Notes (Signed)
Physical Therapy Treatment ?Patient Details ?Name: Shawn Medina ?MRN: [id number] ?DOB: May 03, 1991 ?Today's Date: 10/28/2021 ? ? ?History of Present Illness Pt is a 31 yo male s/p L5-S1 ALIF. PMH of sleep apnea, HTN, anxiety, depression. ? ?  ?PT Comments  ? ? Pt received seated in recliner upon arrival to room and pt agreeable to therapy.  Pt able to participate in mobility training including stair training with good technique.  Pt able to ambulate to the gym, take a seated rest break while given verbal cuing for proper performance of the stairs at home.  Pt then attempted and was able to perform with good recall of instruction.  Pt then transitioned back to room in recliner, when pt asked if he could get assistance with ADL's such as brushing teeth, putting on deodorant, and "washing up".  Pt transferred to the sink and was able to brush his teeth prior to feeling as his BP was dropping.  Pt lost color as well and was directed back onto the bed.  He requested water and was assisted in bed and placed in Trendelenburg to assist with normalizing BP.  Pt placed back into supine position with HOB elevated once pt was no longer experiencing symptoms.  Pt given call bell, phone, and table with all needs met.  Current discharge plans to home with outpatient PT remain appropriate at this time.  Pt will continue to benefit from skilled therapy in order to address deficits listed below. ?    ? ?  ?Recommendations for follow up therapy are one component of a multi-disciplinary discharge planning process, led by the attending physician.  Recommendations may be updated based on patient status, additional functional criteria and insurance authorization. ? ?Follow Up Recommendations ? Outpatient PT ?  ?  ?Assistance Recommended at Discharge Intermittent Supervision/Assistance  ?Patient can return home with the following A little help with walking and/or transfers;A little help with bathing/dressing/bathroom;Assistance with  cooking/housework;Assist for transportation;Help with stairs or ramp for entrance;Direct supervision/assist for medications management ?  ?Equipment Recommendations ? Rolling walker (2 wheels)  ?  ?Recommendations for Other Services   ? ? ?  ?Precautions / Restrictions Precautions ?Precautions: Back;Fall ?Restrictions ?Weight Bearing Restrictions: No  ?  ? ?Mobility ? Bed Mobility ?Overal bed mobility: Needs Assistance ?Bed Mobility: Rolling, Sidelying to Sit ?Rolling: Supervision ?Sidelying to sit: Min guard ?  ?  ?  ?General bed mobility comments: cuing for technique ?  ? ?Transfers ?Overall transfer level: Needs assistance ?Equipment used: Rolling walker (2 wheels) ?Transfers: Sit to/from Stand ?Sit to Stand: Supervision ?  ?  ?  ?  ?  ?  ?  ? ?Ambulation/Gait ?Ambulation/Gait assistance: Supervision ?Gait Distance (Feet): 140 Feet ?Assistive device: Rolling walker (2 wheels) ?Gait Pattern/deviations: Step-through pattern, Decreased stride length ?Gait velocity: decreased ?  ?  ?General Gait Details: improved gait velocity and quality over time, able to decrease weight bearing in BUE ? ? ?Stairs ?Stairs: Yes ?Stairs assistance: Min guard ?Stair Management: Two rails, Step to pattern, Forwards ?Number of Stairs: 4 ?General stair comments: CGA used for safety, good management of the stairs. ? ? ?Wheelchair Mobility ?  ? ?Modified Rankin (Stroke Patients Only) ?  ? ? ?  ?Balance Overall balance assessment: Needs assistance ?Sitting-balance support: No upper extremity supported, Feet supported ?Sitting balance-Leahy Scale: Good ?  ?  ?Standing balance support: Bilateral upper extremity supported ?Standing balance-Leahy Scale: Fair ?Standing balance comment: preferred BUE support ?  ?  ?  ?  ?  ?  ?  ?  ?  ?  ?  ?  ? ?  ?  Cognition Arousal/Alertness: Awake/alert ?Behavior During Therapy: [facility] for tasks assessed/performed ?Overall Cognitive Status: Within Functional Limits for tasks assessed ?  ?  ?  ?  ?  ?  ?  ?  ?  ?   ?  ?  ?  ?  ?  ?  ?  ?  ?  ? ?  ?Exercises   ? ?  ?General Comments   ?  ?  ? ?Pertinent Vitals/Pain Pain Assessment ?Pain Assessment: Faces ?Faces Pain Scale: Hurts even more  ? ? ?Home Living   ?  ?  ?  ?  ?  ?  ?  ?  ?  ?   ?  ?Prior Function    ?  ?  ?   ? ?PT Goals (current goals can now be found in the care plan section) Acute Rehab PT Goals ?Patient Stated Goal: to have less pain ?PT Goal Formulation: With patient ?Time For Goal Achievement: 11/10/21 ?Potential to Achieve Goals: Good ?Progress towards PT goals: Progressing toward goals ? ?  ?Frequency ? ? ? 7X/week ? ? ? ?  ?PT Plan Current plan remains appropriate  ? ? ?Co-evaluation   ?  ?  ?  ?  ? ?  ?AM-PAC PT "6 Clicks" Mobility   ?Outcome Measure ? Help needed turning from your back to your side while in a flat bed without using bedrails?: A Little ?Help needed moving from lying on your back to sitting on the side of a flat bed without using bedrails?: A Little ?Help needed moving to and from a bed to a chair (including a wheelchair)?: A Little ?Help needed standing up from a chair using your arms (e.g., wheelchair or bedside chair)?: None ?Help needed to walk in hospital room?: None ?Help needed climbing 3-5 steps with a railing? : A Little ?6 Click Score: 20 ? ?  ?End of Session Equipment Utilized During Treatment: Gait belt ?Activity Tolerance: Patient tolerated treatment well ?Patient left: in chair;with call bell/phone within reach ?Nurse Communication: Mobility status ?PT Visit Diagnosis: Other abnormalities of gait and mobility (R26.89);Difficulty in walking, not elsewhere classified (R26.2);Muscle weakness (generalized) (M62.81);Pain ?Pain - Right/Left:  (midline) ?Pain - part of body:  (back) ?  ? ? ?Time: 9983-3825 ?PT Time Calculation (min) (ACUTE ONLY): 29 min ? ?Charges:  $Gait Training: 23-37 mins          ?          ? ?Gwenlyn Saran, PT, DPT ?10/28/21, 2:09 PM ? ? ? ?WASYL DORNFELD ?10/28/2021, 2:04 PM ? ?

## 2021-10-29 MED ORDER — POLYETHYLENE GLYCOL 3350 17 G PO PACK
17.0000 g | PACK | Freq: Every day | ORAL | Status: DC
Start: 2021-10-30 — End: 2021-10-30
  Filled 2021-10-29: qty 1

## 2021-10-29 NOTE — Plan of Care (Signed)
?  Problem: Education: ?Goal: Knowledge of General Education information will improve ?Description: Including pain rating scale, medication(s)/side effects and non-pharmacologic comfort measures ?Outcome: Progressing ?  ?Problem: Health Behavior/Discharge Planning: ?Goal: Ability to manage health-related needs will improve ?Outcome: Progressing ?  ?Problem: Clinical Measurements: ?Goal: Ability to maintain clinical measurements within normal limits will improve ?Outcome: Progressing ?Goal: Will remain free from infection ?Outcome: Progressing ?Goal: Diagnostic test results will improve ?Outcome: Progressing ?Goal: Respiratory complications will improve ?Outcome: Progressing ?Goal: Cardiovascular complication will be avoided ?Outcome: Progressing ?  ?Problem: Activity: ?Goal: Risk for activity intolerance will decrease ?Outcome: Progressing ?  ?Problem: Nutrition: ?Goal: Adequate nutrition will be maintained ?Outcome: Progressing ?  ?Problem: Coping: ?Goal: Level of anxiety will decrease ?Outcome: Progressing ?  ?Problem: Elimination: ?Goal: Will not experience complications related to bowel motility ?Outcome: Not Progressing ?Goal: Will not experience complications related to urinary retention ?Outcome: Progressing ?  ?Problem: Pain Managment: ?Goal: General experience of comfort will improve ?Outcome: Progressing ?  ?Problem: Safety: ?Goal: Ability to remain free from injury will improve ?Outcome: Progressing ?  ?

## 2021-10-29 NOTE — TOC Progression Note (Signed)
Transition of Care (TOC) - Progression Note  ? ? ?Patient Details  ?Name: Shawn Medina ?MRN: [id number] ?Date of Birth: March 06, 1991 ? ?Transition of Care (TOC) CM/SW Contact  ?Luvenia Redden, RN ?Phone Number: ?10/29/2021, 12:35 PM ? ?Clinical Narrative:    ?Spoke with the pt concerning DME via [employer] however out-of-pocket expense for $42 pt does not wish to purchase the RW and will go through the [place] for the RW and outpatient therapy recommended. ? ?No addition needs at this time. ? ? ?Expected Discharge Plan: OP Rehab ?Barriers to Discharge: Barriers Resolved ? ?Expected Discharge Plan and Services ?Expected Discharge Plan: OP Rehab ?  ?Discharge Planning Services: CM Consult ?  ?Living arrangements for the past 2 months: Single Family Home ?                ?DME Arranged: Walker rolling ?DME Agency: [employer] ?Date DME Agency Contacted: 10/28/21 ?Time DME Agency Contacted: [phone] ?Representative spoke with at DME Agency: Intake ?HH Arranged: NA ?  ?  ?  ?  ? ? ?Social Determinants of Health (SDOH) Interventions ?  ? ?Readmission Risk Interventions ?   ? View : No data to display.  ?  ?  ?  ? ? ?

## 2021-10-29 NOTE — Progress Notes (Signed)
? ?   Attending Progress Note ? ?History: Dock M [place] is here for pars defect and spondylolisthesis. ? ?POD3: Attempted enema yesterday, walking in halls. States pain is controlled and legs feel good. Has not had a BM but is having flatus. Tolerating diet with minimal nausea ? ?POD2: continues to pass gas. Unfortunately no BM yet. Back pain improved from yesterday ? ?POD1: Passing gas.  Having significant back pain. ? ?Physical Exam: ?Vitals:  ? 10/29/21 0700 10/29/21 0838  ?BP: 122/80 125/75  ?Pulse: 88 83  ?Resp: 18 18  ?Temp: 97.7 ?F (36.5 ?C) 98.7 ?F (37.1 ?C)  ?SpO2: 97% 97%  ? ? ?AA Ox3 ? ?Strength:5/5 throughout BLE  ?Sensation intact to light touch ?Incisions c/d/i ? ?Data: ? ?Recent Labs  ?Lab 10/27/21 ?9528  ?CREATININE 0.92  ? ?No results for input(s): AST, ALT, ALKPHOS in the last 168 hours. ? ?Invalid input(s): TBILI  ? Recent Labs  ?Lab 10/27/21 ?4132  ?WBC 15.8*  ?HGB 13.3  ?HCT 37.7*  ?PLT 203  ? ?No results for input(s): APTT, INR in the last 168 hours.  ?   ? ? ?Other tests/results: n/a ? ?Assessment/Plan: ? ?Lamone Ferrelli [place] is doing as expected after L5-S1 ALIF. ? ?- mobilize, ambulating in halls ?- pain control adequate ?- DVT prophylaxis ?- PTOT ?- continue bowel regimen today.  ? ?Lucy Chris, MD ?Department of Neurosurgery ? ? ? ?

## 2021-10-29 NOTE — Progress Notes (Signed)
Physical Therapy Treatment ?Patient Details ?Name: Shawn Medina ?MRN: [id number] ?DOB: [birth year] ?Today's Date: 10/29/2021 ? ? ?History of Present Illness Pt is a 31 yo male s/p L5-S1 ALIF. PMH of sleep apnea, HTN, anxiety, depression. ? ?  ?PT Comments  ? ? Pt awake and ready to work with PT.  Pt is making good progress demonstrating decrease assist for transfers and bed mobility, Mod I .  Pt is progressing with confidence with gait but continues to fatigue around 160'.  Pt was challenged with seated and standing LE strengthening exercises performing slowly and carefully.  Outpatient PT is still recommended and current POC will continue to benefit pt.  ?Recommendations for follow up therapy are one component of a multi-disciplinary discharge planning process, led by the attending physician.  Recommendations may be updated based on patient status, additional functional criteria and insurance authorization. ? ?Follow Up Recommendations ? Outpatient PT ?  ?  ?Assistance Recommended at Discharge Intermittent Supervision/Assistance  ?Patient can return home with the following A little help with walking and/or transfers;A little help with bathing/dressing/bathroom;Assistance with cooking/housework;Assist for transportation;Help with stairs or ramp for entrance;Direct supervision/assist for medications management ?  ?Equipment Recommendations ? Rolling walker (2 wheels)  ?  ?Recommendations for Other Services   ? ? ?  ?Precautions / Restrictions Precautions ?Precautions: Back;Fall ?Precaution Booklet Issued: No ?Restrictions ?Weight Bearing Restrictions: No  ?  ? ?Mobility ? Bed Mobility ?Overal bed mobility: Modified Independent ?Bed Mobility: Rolling, Sidelying to Sit ?Rolling: Modified independent (Device/Increase time) ?Sidelying to sit: Modified independent (Device/Increase time) ?  ?  ?Sit to sidelying: Modified independent (Device/Increase time) ?General bed mobility comments: increase time and bed controls used  due to pain. ?  ? ?Transfers ?Overall transfer level: Modified independent ?Equipment used: Rolling walker (2 wheels) ?Transfers: Sit to/from Stand ?Sit to Stand: Modified independent (Device/Increase time) ?  ?  ?  ?  ?  ?General transfer comment: Pt getting up and going to bathroom by himself. RN aware. ?  ? ?Ambulation/Gait ?Ambulation/Gait assistance: Supervision ?Gait Distance (Feet): 140 Feet ?Assistive device: Rolling walker (2 wheels) ?Gait Pattern/deviations: Step-through pattern, Decreased stride length ?Gait velocity: decreased ?  ?  ?General Gait Details: improved gait velocity and quality over time, able to decrease weight bearing in BUE ? ? ?Stairs ?Stairs: Yes ?Stairs assistance: Supervision ?Stair Management: Two rails, Step to pattern, Forwards ?Number of Stairs: 4 ?General stair comments: good management of the stairs. ? ? ?Wheelchair Mobility ?  ? ?Modified Rankin (Stroke Patients Only) ?  ? ? ?  ?Balance Overall balance assessment: Needs assistance, Modified Independent ?Sitting-balance support: No upper extremity supported, Feet supported ?Sitting balance-Leahy Scale: Good ?Sitting balance - Comments: still painful movement ?  ?Standing balance support: Bilateral upper extremity supported ?Standing balance-Leahy Scale: Good ?Standing balance comment: preferred BUE support ?  ?  ?  ?  ?  ?  ?  ?  ?  ?  ?  ?  ? ?  ?Cognition Arousal/Alertness: Awake/alert ?Behavior During Therapy: [facility] for tasks assessed/performed ?Overall Cognitive Status: Within Functional Limits for tasks assessed ?  ?  ?  ?  ?  ?  ?  ?  ?  ?  ?  ?  ?  ?  ?  ?  ?  ?  ?  ? ?  ?Exercises Other Exercises ?Other Exercises: LAQs x5 alternating, ankle/heel raises standing with RW x10, seated adduction squeezes x5. Cues for technique. ? ?  ?General Comments   ?  ?  ? ?  Pertinent Vitals/Pain Pain Assessment ?Pain Score: 7  ?Pain Location: back ?Pain Descriptors / Indicators: Pressure, Aching, Sore, Heaviness  ? ? ?Home Living   ?  ?  ?   ?  ?  ?  ?  ?  ?  ?   ?  ?Prior Function    ?  ?  ?   ? ?PT Goals (current goals can now be found in the care plan section) Acute Rehab PT Goals ?Patient Stated Goal: to have less pain ?PT Goal Formulation: With patient ?Time For Goal Achievement: 11/10/21 ?Potential to Achieve Goals: Good ?Progress towards PT goals: Progressing toward goals ? ?  ?Frequency ? ? ? 7X/week ? ? ? ?  ?PT Plan Current plan remains appropriate  ? ? ?Co-evaluation   ?  ?  ?  ?  ? ?  ?AM-PAC PT "6 Clicks" Mobility   ?Outcome Measure ?   ?Help needed moving from lying on your back to sitting on the side of a flat bed without using bedrails?: A Little ?Help needed moving to and from a bed to a chair (including a wheelchair)?: A Little ?Help needed standing up from a chair using your arms (e.g., wheelchair or bedside chair)?: None ?Help needed to walk in hospital room?: None ?Help needed climbing 3-5 steps with a railing? : A Little ?6 Click Score: 17 ? ?  ?End of Session Equipment Utilized During Treatment: Gait belt ?Activity Tolerance: Patient tolerated treatment well ?Patient left: in bed;Other (comment) (RN aware; pt has been safely taking himself to the bathroom.) ?Nurse Communication: Mobility status ?PT Visit Diagnosis: Other abnormalities of gait and mobility (R26.89);Difficulty in walking, not elsewhere classified (R26.2);Muscle weakness (generalized) (M62.81);Pain ?  ? ? ?Time: [id number] ?PT Time Calculation (min) (ACUTE ONLY): 25 min ? ?Charges:  $Gait Training: 8-22 mins ?$Therapeutic Exercise: 8-22 mins          ?          ? ?Bjorn Loser, PTA  ?10/29/21, 2:08 PM  ?

## 2021-10-30 MED ORDER — METHOCARBAMOL 500 MG PO TABS: 500.0000 mg | ORAL_TABLET | Freq: Four times a day (QID) | ORAL | 0 refills | Status: DC

## 2021-10-30 MED ORDER — POLYETHYLENE GLYCOL 3350 17 G PO PACK: 17.0000 g | PACK | Freq: Every day | ORAL | 0 refills | Status: DC

## 2021-10-30 MED ORDER — SENNA 8.6 MG PO TABS: 1.0000 | ORAL_TABLET | Freq: Two times a day (BID) | ORAL | 0 refills | Status: DC

## 2021-10-30 MED ORDER — HYDROCODONE-ACETAMINOPHEN 5-325 MG PO TABS: 1.0000 | ORAL_TABLET | ORAL | 0 refills | Status: DC | PRN

## 2021-10-30 MED ORDER — CELECOXIB 200 MG PO CAPS: 200.0000 mg | ORAL_CAPSULE | Freq: Two times a day (BID) | ORAL | 0 refills | Status: DC

## 2021-10-30 NOTE — Progress Notes (Signed)
1215 ?D/c paperwork reviewed with pt and wife. All questions answered IV removed pt ready for d/c ?

## 2021-10-30 NOTE — Progress Notes (Signed)
Physical Therapy Treatment ?Patient Details ?Name: Shawn Medina ?MRN: [id number] ?DOB: 1991/06/23 ?Today's Date: 10/30/2021 ? ? ?History of Present Illness Pt is a 31 yo male s/p L5-S1 ALIF. PMH of sleep apnea, HTN, anxiety, depression. ? ?  ?PT Comments  ? ? Walking in room on his own with walker and no difficulty.  He is able to walk x 2 lap in hallways with RW and supervision/mod I.  Participated in exercises as described below.  Overall progressing well.  Plans to get walker from his mother prior to discharge. ?  ?Recommendations for follow up therapy are one component of a multi-disciplinary discharge planning process, led by the attending physician.  Recommendations may be updated based on patient status, additional functional criteria and insurance authorization. ? ?Follow Up Recommendations ? Outpatient PT ?  ?  ?Assistance Recommended at Discharge Intermittent Supervision/Assistance  ?Patient can return home with the following A little help with walking and/or transfers;A little help with bathing/dressing/bathroom;Assistance with cooking/housework;Assist for transportation;Help with stairs or ramp for entrance;Direct supervision/assist for medications management ?  ?Equipment Recommendations ? Rolling walker (2 wheels)  ?  ?Recommendations for Other Services   ? ? ?  ?Precautions / Restrictions Precautions ?Precautions: Back;Fall ?Precaution Booklet Issued: No ?Restrictions ?Weight Bearing Restrictions: No  ?  ? ?Mobility ? Bed Mobility ?Overal bed mobility: Modified Independent ?  ?Rolling: Modified independent (Device/Increase time) ?Sidelying to sit: Modified independent (Device/Increase time) ?  ?  ?Sit to sidelying: Modified independent (Device/Increase time) ?  ?  ? ?Transfers ?Overall transfer level: Modified independent ?Equipment used: Rolling walker (2 wheels) ?Transfers: Sit to/from Stand ?Sit to Stand: Modified independent (Device/Increase time) ?  ?  ?  ?  ?  ?  ?   ? ?Ambulation/Gait ?Ambulation/Gait assistance: Supervision ?Gait Distance (Feet): 400 Feet ?Assistive device: Rolling walker (2 wheels) ?Gait Pattern/deviations: Step-through pattern, Decreased stride length ?Gait velocity: decreased ?  ?  ?  ? ? ?Stairs ?  ?  ?  ?  ?  ? ? ?Wheelchair Mobility ?  ? ?Modified Rankin (Stroke Patients Only) ?  ? ? ?  ?Balance Overall balance assessment: Needs assistance, Modified Independent ?Sitting-balance support: No upper extremity supported, Feet supported ?Sitting balance-Leahy Scale: Good ?Sitting balance - Comments: still painful movement ?  ?Standing balance support: Bilateral upper extremity supported ?Standing balance-Leahy Scale: Good ?Standing balance comment: preferred BUE support ?  ?  ?  ?  ?  ?  ?  ?  ?  ?  ?  ?  ? ?  ?Cognition Arousal/Alertness: Awake/alert ?Behavior During Therapy: [facility] for tasks assessed/performed ?Overall Cognitive Status: Within Functional Limits for tasks assessed ?  ?  ?  ?  ?  ?  ?  ?  ?  ?  ?  ?  ?  ?  ?  ?  ?  ?  ?  ? ?  ?Exercises Other Exercises ?Other Exercises: standing heel raises, 4 way hip, marches x 10 with walker support ? ?  ?General Comments   ?  ?  ? ?Pertinent Vitals/Pain Pain Assessment ?Pain Assessment: Faces ?Faces Pain Scale: Hurts little more ?Pain Location: stitches ?Pain Descriptors / Indicators: Sore ?Pain Intervention(s): Limited activity within patient's tolerance, Monitored during session, Repositioned  ? ? ?Home Living   ?  ?  ?  ?  ?  ?  ?  ?  ?  ?   ?  ?Prior Function    ?  ?  ?   ? ?PT  Goals (current goals can now be found in the care plan section) Progress towards PT goals: Progressing toward goals ? ?  ?Frequency ? ? ? 7X/week ? ? ? ?  ?PT Plan Current plan remains appropriate  ? ? ?Co-evaluation   ?  ?  ?  ?  ? ?  ?AM-PAC PT "6 Clicks" Mobility   ?Outcome Measure ? Help needed turning from your back to your side while in a flat bed without using bedrails?: None ?Help needed moving from lying on your back to  sitting on the side of a flat bed without using bedrails?: None ?Help needed moving to and from a bed to a chair (including a wheelchair)?: None ?Help needed standing up from a chair using your arms (e.g., wheelchair or bedside chair)?: None ?Help needed to walk in hospital room?: A Little ?Help needed climbing 3-5 steps with a railing? : A Little ?6 Click Score: 22 ? ?  ?End of Session Equipment Utilized During Treatment: Gait belt ?Activity Tolerance: Patient tolerated treatment well ?Patient left: in bed ?Nurse Communication: Mobility status ?PT Visit Diagnosis: Other abnormalities of gait and mobility (R26.89);Difficulty in walking, not elsewhere classified (R26.2);Muscle weakness (generalized) (M62.81);Pain ?  ? ? ?Time: 2707-8675 ?PT Time Calculation (min) (ACUTE ONLY): 23 min ? ?Charges:  $Gait Training: 8-22 mins ?$Therapeutic Exercise: 8-22 mins          ?         Danielle Dess, PTA ?10/30/21, 10:49 AM ? ?

## 2021-10-30 NOTE — Discharge Summary (Addendum)
Physician Discharge Summary  ?Patient ID: ?Shawn Medina ?MRN: [id number] ?DOB/AGE: 1990-07-19 31 y.o. ? ?Admit date: 10/26/2021 ?Discharge date: 10/30/2021 ? ?Admission Diagnoses: Lumbar Stenosis  ? ?Discharge Diagnoses:  ?Principal Problem: ?  S/P lumbar fusion ? ? ?Discharged Condition: good ? ?Hospital Course: [name] was admitted after undergoing L5/S1 ALIF on 4/26. He did well post operatively. His pain was treated with medications and controlled. He was up ambulating in the halls. He had delayed return of bowel function but was having flatus. On POD 3, he was able to have some BMs and at that time deemed safe for discharge. He was examined on 4/30 and seen to be at neurological baseline.  ? ?Consults: None ? ?Discharge Exam: ?Blood pressure 130/82, pulse 76, temperature 97.9 ?F (36.6 ?C), resp. rate 18, height 5\' 8"  (1.727 m), weight 98.4 kg, SpO2 97 %. ?Neurologic: Alert and oriented X 3, normal strength and tone.  ?5/5 strength in lower extremities ?Sensation intact ? ?Disposition: Discharge disposition: 01-Home or Self Care ? ? ? ? ? ? ?Discharge Instructions   ? ? Incentive spirometry RT   Complete by: As directed ?  ? Remove dressing in 24 hours   Complete by: As directed ?  ? ?  ? ? ? ? Follow-up Information   ? ? , [place] Follow up in 2 week(s).   ?Why: For post-op and incision check. This appointment date and time is included in your pre-op paperwork. Please call the office with any questions or concerns regarding appointment date or time ?Contact information: ?[address] ?[place] ?[phone] ? ? ?  ?  ? ?  ?  ? ?  ? ? ?Signed: ?[phone] ?10/30/2021, 12:01 PM ? ? ?

## 2021-11-07 ENCOUNTER — Other Ambulatory Visit: Payer: Self-pay | Admitting: Podiatry

## 2022-01-09 ENCOUNTER — Telehealth: Payer: Self-pay

## 2022-01-09 NOTE — Telephone Encounter (Signed)
Shawn Medina stopped by our office requesting a work note to stay out of work until his next appointment with Dr Myer Haff. This reflects what is written in Dr Lucienne Capers last note. Work note was printed and given to Shawn Medina.

## 2022-01-23 ENCOUNTER — Other Ambulatory Visit: Payer: Self-pay

## 2022-01-23 DIAGNOSIS — Z981 Arthrodesis status: Secondary | ICD-10-CM

## 2022-01-24 ENCOUNTER — Ambulatory Visit
Admission: RE | Admit: 2022-01-24 | Discharge: 2022-01-24 | Disposition: A | Discharge status: Home / Self Care | Payer: No Typology Code available for payment source | Source: Ambulatory Visit | Attending: Neurosurgery | Admitting: Neurosurgery

## 2022-01-24 ENCOUNTER — Ambulatory Visit: Payer: No Typology Code available for payment source | Admitting: Neurosurgery

## 2022-01-24 ENCOUNTER — Encounter: Payer: Self-pay | Admitting: Neurosurgery

## 2022-01-24 ENCOUNTER — Ambulatory Visit
Admission: RE | Admit: 2022-01-24 | Discharge: 2022-01-24 | Disposition: A | Discharge status: Home / Self Care | Payer: No Typology Code available for payment source | Attending: Neurosurgery | Admitting: Neurosurgery

## 2022-01-24 VITALS — BP 122/78 | Ht 68.0 in | Wt 229.8 lb

## 2022-01-24 DIAGNOSIS — Z981 Arthrodesis status: Secondary | ICD-10-CM

## 2022-01-24 MED ORDER — METHOCARBAMOL 500 MG PO TABS
500.0000 mg | ORAL_TABLET | Freq: Four times a day (QID) | ORAL | 3 refills | Status: DC
Start: 2022-01-24 — End: 2022-08-01

## 2022-01-24 NOTE — Progress Notes (Signed)
   DOS: L5/S1 ALIF/PSF 10/26/21  HISTORY OF PRESENT ILLNESS: 01/24/2022 [name] is status post anterior lumbar interbody fusion with posterior fusion.  He is doing well with little pain down his legs.  He is having some discomfort in his back particular when he goes to sleep at night.   PHYSICAL EXAMINATION:   Vitals:   01/24/22 1530  BP: 122/78   General: Patient is well developed, well nourished, calm, collected, and in no apparent distress.  NEUROLOGICAL:  General: In no acute distress.  Awake, alert, oriented to person, place, and time. Pupils equal round and reactive to light.   Strength:  Side Iliopsoas Quads Hamstring PF DF EHL  R 5 5 5 5 5 5   L 5 5 5 5 5 5    Incision c/d/i   ROS (Neurologic): Negative except as noted above  IMAGING: No complications noted  ASSESSMENT/PLAN:  Shawn Medina is doing well after anterior lumbar interbody fusion with posterior fusion.  That being said, he is not ready to return to work.  He still having some discomfort in his back.  I recommended that he start ibuprofen 800 mg 3 times daily as needed.  Additionally, I will refill his methocarbamol.  I will see him back in 6 months.  I will keep him out of work for now.  He will let me know when he feels ready to return to work.  I spent a total of 15 minutes in face-to-face and non-face-to-face activities related to this patient's care today.   MD, [facility] Department of Neurosurgery

## 2022-04-25 ENCOUNTER — Ambulatory Visit (HOSPITAL_COMMUNITY): Payer: No Typology Code available for payment source | Admitting: Licensed Clinical Social Worker

## 2022-05-02 ENCOUNTER — Encounter (HOSPITAL_COMMUNITY): Payer: Self-pay

## 2022-05-02 ENCOUNTER — Ambulatory Visit (INDEPENDENT_AMBULATORY_CARE_PROVIDER_SITE_OTHER): Payer: No Typology Code available for payment source | Admitting: Licensed Clinical Social Worker

## 2022-05-02 DIAGNOSIS — F322 Major depressive disorder, single episode, severe without psychotic features: Secondary | ICD-10-CM

## 2022-05-02 NOTE — Progress Notes (Signed)
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 He is dealing with pain and the last 6 months have been hard. He lost his job as the place went out of business. He needs a new roof and his home owner's dropped them causing them problems with their mortgage company.    Visit Diagnosis: Major Depression, Single Episode, Severe without psychotic features    CCA Screening, Triage and Referral (STR)  Patient Reported Information How did you hear about [place]? [facility]  Referral name: No data recorded Referral phone number: No data recorded  Whom do you see for routine medical problems? Primary Care  Practice/Facility Name: [facility] in [facility]  Practice/Facility Phone Number: No data recorded Name of Contact: No data recorded Contact Number: No data recorded Contact Fax Number: No data recorded Prescriber Name: Dr. Foye Spurling  Prescriber Address (if known): No data recorded  What Is the Reason for Your Visit/Call Today? No data recorded How Long Has This Been Causing You Problems? > than 6 months  What Do You Feel Would Help You the Most Today? Treatment for Depression or other mood problem   Have You Recently Been in Any Inpatient Treatment (Hospital/Detox/Crisis Center/28-Day Program)? No  Name/Location of Program/Hospital:No data recorded How Long Were You There? No data recorded When Were You Discharged? No data recorded  Have You Ever Received Services From [facility] Before? Yes  Who Do You See at [facility]? Dr. Marcell Barlow was surgeon who did the Medina surgery.   Have You Recently Had Any Thoughts About Hurting Yourself? No  Are You Planning to Commit Suicide/Harm Yourself At This time? No   Have you Recently Had Thoughts About Hurting  Someone Shawn Medina? No  Explanation: No data recorded  Have You Used Any Alcohol or Drugs in the Past 24 Hours? No  How Long Ago Did You Use Drugs or Alcohol? No data recorded What Did You Use and How Much? No data recorded  Do You Currently Have a Therapist/Psychiatrist? No  Name of Therapist/Psychiatrist: No data recorded  Have You Been Recently Discharged From Any Office Practice or Programs? No  Explanation of Discharge From Practice/Program: No data recorded    CCA Screening Triage Referral Assessment Type of Contact: Face-to-Face  Is this Initial or Reassessment? No data recorded Date Telepsych consult ordered in [facility]:  No data recorded Time Telepsych consult ordered in [facility]:  No data recorded  Patient Reported Information Reviewed? No data recorded Patient Left Without Being Seen? No data recorded Reason for Not Completing Assessment: No data recorded  Collateral Involvement: No data recorded  Does Patient Have a Court Appointed Legal Guardian? No data recorded Name and Contact of Legal Guardian: No data recorded If Minor and Not Living with Parent(s), Who has Custody? No data recorded Is CPS involved or ever been involved? Never  Is APS involved or ever been involved? Never   Patient Determined To Be At Risk for Harm To Self or Others Based on Review of Patient Reported Information or Presenting Complaint? No  Method: No data recorded Availability of Means: No data recorded Intent: No data recorded Notification Required: No data recorded Additional Information for Danger to Others Potential: No data recorded Additional Comments for Danger to Others Potential: No data recorded Are  There Guns or Other Weapons in Your Home? No data recorded Types of Guns/Weapons: No data recorded Are These Weapons Safely Secured?                            No data recorded Who Could Verify You Are Able To Have These Secured: No data recorded Do You Have any Outstanding Charges, Pending  Court Dates, Parole/Probation? No data recorded Contacted To Inform of Risk of Harm To Self or Others: No data recorded  Location of Assessment: Other (comment) ([address])   Does Patient Present under Involuntary Commitment? No  IVC Papers Initial File Date: No data recorded  [place]    Patient Currently Receiving the Following Services: Medication Management   Determination of Need: Routine (7 days)   Options For Referral: Medication Management; Outpatient Therapy     CCA Biopsychosocial Intake/Chief Complaint:  depression  Current Symptoms/Problems: No data recorded  Patient Reported Schizophrenia/Schizoaffective Diagnosis in Past: No   Strengths: has been trying to keep up with the house and chores and reward himself by hopping in the truck and going fishing  Preferences: No data recorded Abilities: No data recorded  Type of Services Patient Feels are Needed: No data recorded  Initial Clinical Notes/Concerns: No data recorded  Mental Health Symptoms Depression:   Difficulty Concentrating; Fatigue; Hopelessness; Increase/decrease in appetite; Irritability; Sleep (too much or little); Worthlessness (was sleeping "very little" before being started on Trazodone; now is averaging about 9 hours of sleep)   Duration of Depressive symptoms:  Greater than two weeks   Mania:   N/A   Anxiety:    Worrying (worries about finances and trying to recover from his Medina surgery so he can get Medina to work)   Psychosis:   None   Duration of Psychotic symptoms: No data recorded  Trauma:  No data recorded  Obsessions:   N/A   Compulsions:   N/A   Inattention:   N/A   Hyperactivity/Impulsivity:   N/A   Oppositional/Defiant Behaviors:   N/A   Emotional Irregularity:   N/A   Other Mood/Personality Symptoms:  No data recorded   Mental Status Exam Appearance and self-care  Stature:   Average   Weight:   Average weight    Clothing:   Casual   Grooming:   Normal   Cosmetic use:   None   Posture/gait:   Normal   Motor activity:   Not Remarkable   Sensorium  Attention:   Normal   Concentration:   Normal   Orientation:   X5   Recall/memory:   Normal   Affect and Mood  Affect:   Appropriate; Tearful (tears up briefly but affect is appropriate for the most part)   Mood:   Depressed; Anxious   Relating  Eye contact:   Normal   Facial expression:   Responsive   Attitude toward examiner:   Cooperative   Thought and Language  Speech flow:  Clear and Coherent   Thought content:   Appropriate to Mood and Circumstances   Preoccupation:   None   Hallucinations:   None   Organization:  No data recorded  Affiliated Computer ServicesExecutive Functions  Fund of Knowledge:   Average   Intelligence:   Average   Abstraction:   Abstract   Judgement:   Good   Reality Testing:   Adequate   Insight:   Good   Decision Making:   Normal  Social Functioning  Social Maturity:   Responsible   Social Judgement:   Normal   Stress  Stressors:   Housing; Illness; Financial; Work; Family conflict (issues with wife's side of the family; stepfather said Leopold needed to get his "lazy butt" off the couch and start helping around the house)   Coping Ability:   Overwhelmed   Skill Deficits:  No data recorded  Supports:   Family ("probably my dad")     Religion: Religion/Spirituality Are You A Religious Person?: Yes What is Your Religious Affiliation?: Christian How Might This Affect Treatment?: tries to sit Medina and "let God handle it"  Leisure/Recreation: Leisure / Recreation Do You Have Hobbies?: Yes Leisure and Hobbies: fishing  Exercise/Diet: Exercise/Diet Do You Exercise?: No (tries to get activity working around the house) Have [employer] or Lost A Significant Amount of Weight in the Past Six Months?: No Do You Follow a Special Diet?: Yes Type of Diet: 30, 30, 30 Rule Do You  Have Any Trouble Sleeping?: No (not with Trazodone)   CCA Employment/Education Employment/Work Situation: Employment / Work Situation Employment Situation: Unemployed Patient's Job has Been Impacted by Current Illness: Yes Describe how Patient's Job has Been Impacted: 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 What is the Longest Time Patient has Held a Job?: [employer] 2013-2020 Where was the Patient Employed at that Time?: 7 years Has Patient ever Been in the [place]?: Yes (Describe in comment) Did You Receive Any Psychiatric Treatment/Services While in the [place]?: No  Education: Education Is Patient Currently Attending School?: No (He is thinking about trying to get into IT or network security.) Last Grade Completed: 12 Name of High School: [employer] Did Teacher, adult education From [employer]?: Yes (GPA was a 2.75) Did [place]?: No Did [place]?: No Did You Have An Individualized Education Program (IIEP): No Did You Have Any Difficulty At School?: No Patient's Education Has Been Impacted by Current Illness: No   CCA Family/Childhood History Family and Relationship History: Family history Marital status: Married Number of Years Married: 8 What types of issues is patient dealing with in the relationship?: getting along "really good" with his wife who is stressed as she has to work as Financial planner cannot work yet Are you sexually active?: Yes What is your sexual orientation?: Heterosexual Has your sexual activity been affected by drugs, alcohol, medication, or emotional stress?: wife is worried about hurting Paulmichael due to his Medina Does patient have children?: Yes How many children?: 2 How is patient's relationship with their children?: children ages 57 and 51; Kjuan is getting to spend more time with his kids  Childhood History:  Childhood History By whom was/is the patient raised?: Both parents Additional  childhood history information: Terrace grew [place] saying that his childhood was "really good;" he says that both his parents now have issues with depression; his father is having issues with his blood sugar and Julez's mother does not drive and they live next door; he denies any family history of substance use Description of patient's relationship with caregiver when they were a child: it was "good;" his father worked "all the time" at [employer] but they did things on the weekends Patient's description of current relationship with people who raised him/her: "good" How were you disciplined when you got in trouble as a child/adolescent?: "I was whooped" Does patient have siblings?: Yes Number of Siblings: 3 (stepbrother; a  brother and a sister; the stepbrother is by his father's previous marriage) Description of patient's current relationship with siblings: "good" Did patient suffer any verbal/emotional/physical/sexual abuse as a child?: No Did patient suffer from severe childhood neglect?: No Has patient ever been sexually abused/assaulted/raped as an adolescent or adult?: No Was the patient ever a victim of a crime or a disaster?: No Witnessed domestic violence?: No Has patient been affected by domestic violence as an adult?: No  Child/Adolescent Assessment:     CCA Substance Use Alcohol/Drug Use: Alcohol / Drug Use Pain Medications: N/A Prescriptions: Gabapentin, Zoloft, Trazodone, abd Ibdabsetron (for headache-related nausea) Over the Counter: Acetaminophen and Ibuprofen History of alcohol / drug use?: No history of alcohol / drug abuse                         ASAM's:  Six Dimensions of Multidimensional Assessment  Dimension 1:  Acute Intoxication and/or Withdrawal Potential:      Dimension 2:  Biomedical Conditions and Complications:   Dimension 2:  Description of patient's biomedical conditions and  complications: N/A  Dimension 3:   Emotional, Behavioral, or Cognitive Conditions and Complications:  Dimension 3:  Description of emotional, behavioral, or cognitive conditions and complications: N/A  Dimension 4:  Readiness to Change:  Dimension 4:  Description of Readiness to Change criteria: N/A  Dimension 5:  Relapse, Continued use, or Continued Problem Potential:  Dimension 5:  Relapse, continued use, or continued problem potential critiera description: N/A  Dimension 6:  Recovery/Living Environment:  Dimension 6:  Recovery/Iiving environment criteria description: N/A  ASAM Severity Score:    ASAM Recommended Level of Treatment:     Substance use Disorder (SUD)    Recommendations for Services/Supports/Treatments:    DSM5 Diagnoses: Patient Active Problem List   Diagnosis Date Noted   S/P lumbar fusion 10/26/2021   Chest pain, unspecified 05/31/2021   Other specified counseling 05/31/2021   Low Medina pain 05/31/2021   Migraine, unspecified, not intractable, without status migrainosus 05/31/2021   Obstructive sleep apnea (adult) (pediatric) 05/31/2021   Sensorineural hearing loss, bilateral 05/31/2021   Tinnitus, bilateral 05/31/2021   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, moderate Right L5) 05/24/2021   Anterolisthesis of lumbar spine 05/24/2021   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   Chronic pain of both knees 05/24/2021   Lumbar radiculopathy 05/24/2021   Right foot pain 05/24/2021   Chronic migraine without aura, not intractable, without status migrainosus 11/19/2018    Patient Centered Plan: Patient is on the following Treatment Plan(s):  Depression   Referrals to Alternative Service(s): Referred to Alternative Service(s):   Place:   Date:   Time:    Referred to Alternative Service(s):   Place:   Date:   Time:    Referred to Alternative Service(s):   Place:   Date:   Time:    Referred to Alternative Service(s):   Place:   Date:   Time:      Plan: Mikell is agreeable to attending  therapy and will start out being seen weekly by this therapist. He will see a psychiatrist to adjust his anti-depressant medication such that it is working more effectively as he has been on Zoloft 100 mg for about six months with no side effects but his PHQ-9 is over 20 suggesting that he could likely benefit from an increase in dosage.  The therapist talks to Winslow about behavioral pacing in relation to his Medina pain and  explains how he can file a complaint with the [employer] Office concerning the company which has attempted to scam him under the guise of helping him get his disability benefits.   Collaboration of Care: Other N/A  Patient/Guardian was advised Release of Information must be obtained prior to any record release in order to collaborate their care with an outside provider. Patient/Guardian was advised if they have not already done so to contact the registration department to sign all necessary forms in order for [place] to release information regarding their care.   Consent: Patient/Guardian gives verbal consent for treatment and assignment of benefits for services provided during this visit. Patient/Guardian expressed understanding and agreed to proceed.   [address], MA, LCSW, [facility], LCAS 05/02/2022

## 2022-05-02 NOTE — Plan of Care (Signed)
Shawn Medina agrees to the following Care Plan and to the therapist signing electronically on his behalf:  Problem: Major Depression Goal:  Shawn Medina will experience a decrease in his depressive symptoms as evidenced by his PHQ-9 score dropping from a 22 to a 4 or less.  Outcome: Not Applicable Goal: Shawn Medina will experience a decrease in his symptoms of anxiety as evidenced by his GAD-7 dropping from an 8 to a 4 or less.  Outcome: Not Applicable Intervention: Therapist will assist Shawn Medina in identifying and changing thoughts and behaviors that contribute to his depression and anxiety while helping him to find solutions to his problems such as getting his roof repaired, etcetera Note: Reviewed with patient

## 2022-05-09 ENCOUNTER — Ambulatory Visit (HOSPITAL_COMMUNITY): Payer: No Typology Code available for payment source | Admitting: Licensed Clinical Social Worker

## 2022-05-16 ENCOUNTER — Ambulatory Visit (INDEPENDENT_AMBULATORY_CARE_PROVIDER_SITE_OTHER): Payer: No Typology Code available for payment source | Admitting: Licensed Clinical Social Worker

## 2022-05-16 DIAGNOSIS — F322 Major depressive disorder, single episode, severe without psychotic features: Secondary | ICD-10-CM | POA: Diagnosis not present

## 2022-05-16 NOTE — Progress Notes (Signed)
THERAPIST PROGRESS NOTE  Session Time: 11 a.m. to 12 p.m.  Type of Therapy: Individual   Therapist Response/Interventions: Solution Focused/The therapist talks to Shawn Medina about possible options for addressing his roof problem in addition to his vocational and childcare issues.  He makes the observation that Shawn Medina is making progress in starting physical therapy and in contacting the [employer] and the [employer].   Treatment Goals addressed: Shawn Medina will experience a decrease in his depressive symptoms as evidenced by his PHQ-9 score dropping from a 22 to a 4 or less.  Shawn Medina will experience a decrease in his symptoms of anxiety as evidenced by his GAD-7 dropping from an 8 to a 4 or less.   Summary: Shawn Medina presents saying that he started physical therapy  last Wednesday. Additionally, he contacted [employer] and the [place] about his problems with the insurance company that dropped him and is awaiting a response.  He sees his doctor tomorrow so will talk to the doctor about possibly increasing his Zoloft to 150 mg in advance of his appointment with the psychiatrist in a month.    He says that his mood is a "little better" than last week. The therapist talks to Shawn Medina about possible options for getting assistance with his roof recommending that he talk with [employer].  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 The therapist talks to him about Vocational Rehabilitation of which he was not aware and about looking into getting a childcare voucher as his in-laws have been unreliable in watching their kids for them.    Progress Towards Goals: Progressing  Suicidal/Homicidal: No SI/HI reported  Plan: Return again in 1 weeks.  Diagnosis: Severe major depression, single episode, without psychotic features   Collaboration of  Care: Other N/A  Patient/Guardian was advised Release of Information must be obtained prior to any record release in order to collaborate their care with an outside provider. Patient/Guardian was advised if they have not already done so to contact the registration department to sign all necessary forms in order for [place] to release information regarding their care.   Consent: Patient/Guardian gives verbal consent for treatment and assignment of benefits for services provided during this visit. Patient/Guardian expressed understanding and agreed to proceed.   Myrna Blazer, MA, LCSW, [facility], LCAS 05/16/2022

## 2022-05-23 ENCOUNTER — Ambulatory Visit (INDEPENDENT_AMBULATORY_CARE_PROVIDER_SITE_OTHER): Payer: No Typology Code available for payment source | Admitting: Licensed Clinical Social Worker

## 2022-05-23 DIAGNOSIS — F322 Major depressive disorder, single episode, severe without psychotic features: Secondary | ICD-10-CM | POA: Diagnosis not present

## 2022-05-23 NOTE — Progress Notes (Signed)
THERAPIST PROGRESS NOTE  Session Time: 11 a.m. to 12 p.m.  Type of Therapy: Individual   Therapist Response/Interventions: CBT/The therapist talks to Tyrion about thought stopping techniques and addresses his reluctance to ask for help noting that no one can get through life without asking for help and that he needs to persist when told "no" as solving an issue may take numerous attempts to do so.  The therapist recommends that Daviel follow up on his complaint to [employer] with a call and sends him the on-line complaint form for [employer] office as Ivin Booty sent an email.  The therapist recommends that Shonn may need to  talk with both is wife and his father about becoming more responsible such that Zackaria is not left having to troubleshoot problems that have caused via acting irresponsibly.   Treatment Goals addressed: Olin will experience a decrease in his depressive symptoms as evidenced by his PHQ-9 score dropping from a 22 to a 4 or less.  Lynard will experience a decrease in his symptoms of anxiety as evidenced by his GAD-7 dropping from an 8 to a 4 or less.   Summary: Sylvain presents saying that his Zoloft was increased from 100 mg to 200 mg. He is thus far tolerating the 200 mg well.   He says that he has not heard anything from Ladoris Gene noting that he may have emailed the wrong email. He says that his father had a diabetic crisis and he has been building a chicken coup to keep his mind off things.  It is raining today; however, he says that since he put tar on the roof that he has not noticed anymore leaks. He says that he has some issues dealing with stress.  His PHQ-9 has dropped from a 22 to a 13.  The major focus of the session involves Grayer's not wanting to ask others for help and giving up once someone says, "no." Additionally, the therapist and Kasen talk about the stress that he endures as a result of his father neglecting his diabetes and the stress  from his wife having quit her job before having another job as she feels confident that [place] will hire her which has yet to happen.   Progress Towards Goals: Progressing  Suicidal/Homicidal: No SI/HI reported  Plan: Return again in 1 weeks.  Diagnosis: Severe major depression, single episode, without psychotic features   Collaboration of Care: Other N/A  Patient/Guardian was advised Release of Information must be obtained prior to any record release in order to collaborate their care with an outside provider. Patient/Guardian was advised if they have not already done so to contact the registration department to sign all necessary forms in order for [place] to release information regarding their care.   Consent: Patient/Guardian gives verbal consent for treatment and assignment of benefits for services provided during this visit. Patient/Guardian expressed understanding and agreed to proceed.   Myrna Blazer, MA, LCSW, [facility], LCAS 05/23/2022

## 2022-05-30 ENCOUNTER — Ambulatory Visit (HOSPITAL_COMMUNITY): Payer: No Typology Code available for payment source | Admitting: Licensed Clinical Social Worker

## 2022-06-06 ENCOUNTER — Ambulatory Visit (INDEPENDENT_AMBULATORY_CARE_PROVIDER_SITE_OTHER): Payer: No Typology Code available for payment source | Admitting: Licensed Clinical Social Worker

## 2022-06-06 DIAGNOSIS — F322 Major depressive disorder, single episode, severe without psychotic features: Secondary | ICD-10-CM | POA: Diagnosis not present

## 2022-06-06 NOTE — Progress Notes (Signed)
THERAPIST PROGRESS NOTE  Session Time: 1 p.m. to 2 p.m.  Type of Therapy: Individual   Therapist Response/Interventions: Solution-Focused and CBT/The therapist encourages Ramey to still contact [employer] and notes that he can still call [employer] back to point out that things that he failed to mention during their conversation. In response to Silvester's noting that he is not good at talking in these circumstances, the therapist recommends writing out and preparing in advance what he is going to say before having these conversations which he agrees would help.  The therapist models how Berthel might talk to his wife about the job situation and suggests that they consult with the MD who has been treating her wrist for years to see if she can realistically lift up to 50 pounds as she will not listen to Yukio's opinion about this.  The therapist notes that having his friends with roofing experience help him put the new roof on sounds like the most cost effective way to go; however, notes that Jeson would have to call the [place] and get more specifics on how long he will be on full disability if he were to try and finance the job through a roofing company. The therapist also notes that Nyheem likely still needs to stop trying to manage his father's decision making regarding his health as he has enough on his own plate as it is.   He shows Brenda how to access guided [employer] on YouTube and recommends that he start doing at least one daily to relax.   Treatment Goals addressed: Brelan will experience a decrease in his depressive symptoms as evidenced by his PHQ-9 score dropping from a 22 to a 4 or less.  Dusty will experience a decrease in his symptoms of anxiety as evidenced by his GAD-7 dropping from an 8 to a 4 or less.   Summary: Sully presents saying that he is tired as he was helping his brother move things off his property so he does not get evicted  He was  awarded short-term disability; however, he heard back from [employer] being told that he could contact an Tour manager so he could still have homeowners at a cheaper rate than what his insurance company charges but they would have to cover the lapsed coverage.   He says that his wife has had issues with her wrist for three years from a fall she had in the yard a few years ago. His wife got the [place] job but is possibly not going to take it due to thinking it will aggravate her wrist. He says that his wife is thinking of going back to the company that she just left. In spite of this, he says, "financially, we are doing o.k. now." Worst case scenario, he says that he could buy the supplies and do the roof himself.  Keelon says that his father was able to get an arm patch and supplies so is doing well regarding his diabetes.   As for his emotional state, he says, "I wish I could do more" in relation to his back. On days when he hurts, he will not push himself and on days he is not hurting, he is doing too much. He says that his depression is "better" at a "4" on a 1-10 with 10 being most severe; however, his anxiety is "up there" an an "8."  He worries about not being able to do anything about the roof. He is also worried  about his wife and want she wants to do concerned that she has the mentality that now that they have money that she can "slow down." He says that he also has "family drama" noting that his dad is a "bit of a hoarder."   The major focus of the session involves action steps to address his problems and reduce his anxiety.   Progress Towards Goals: Progressing  Suicidal/Homicidal: No SI/HI reported  Plan: Joshuas sees Dr. Morrie Sheldon on 06/19/22 and does not want to schedule a return therapy appointment at this time; however, notes that he may schedule another one after his meeting with her. He will contact this therapist on a p.r.n. basis in the interim.   Diagnosis:  Severe major depression, single episode, without psychotic features   Collaboration of Care: Other N/A  Patient/Guardian was advised Release of Information must be obtained prior to any record release in order to collaborate their care with an outside provider. Patient/Guardian was advised if they have not already done so to contact the registration department to sign all necessary forms in order for [place] to release information regarding their care.   Consent: Patient/Guardian gives verbal consent for treatment and assignment of benefits for services provided during this visit. Patient/Guardian expressed understanding and agreed to proceed.   Myrna Blazer, MA, LCSW, [facility], LCAS 06/06/2022

## 2022-06-19 ENCOUNTER — Ambulatory Visit (HOSPITAL_COMMUNITY)
Payer: No Typology Code available for payment source | Admitting: Student in an Organized Health Care Education/Training Program

## 2022-07-31 ENCOUNTER — Other Ambulatory Visit: Payer: Self-pay

## 2022-07-31 DIAGNOSIS — Z981 Arthrodesis status: Secondary | ICD-10-CM

## 2022-08-01 ENCOUNTER — Encounter: Payer: Self-pay | Admitting: Neurosurgery

## 2022-08-01 ENCOUNTER — Ambulatory Visit (INDEPENDENT_AMBULATORY_CARE_PROVIDER_SITE_OTHER): Payer: BC Managed Care – PPO | Admitting: Neurosurgery

## 2022-08-01 ENCOUNTER — Ambulatory Visit
Admission: RE | Admit: 2022-08-01 | Discharge: 2022-08-01 | Disposition: A | Discharge status: Home / Self Care | Payer: No Typology Code available for payment source | Source: Ambulatory Visit | Attending: Neurosurgery | Admitting: Neurosurgery

## 2022-08-01 ENCOUNTER — Ambulatory Visit
Admission: RE | Admit: 2022-08-01 | Discharge: 2022-08-01 | Disposition: A | Discharge status: Home / Self Care | Payer: No Typology Code available for payment source | Attending: Neurosurgery | Admitting: Neurosurgery

## 2022-08-01 VITALS — BP 158/94 | HR 75 | Temp 98.2°F | Wt 224.2 lb

## 2022-08-01 DIAGNOSIS — Z981 Arthrodesis status: Secondary | ICD-10-CM

## 2022-08-01 DIAGNOSIS — M4306 Spondylolysis, lumbar region: Secondary | ICD-10-CM

## 2022-08-01 DIAGNOSIS — Z09 Encounter for follow-up examination after completed treatment for conditions other than malignant neoplasm: Secondary | ICD-10-CM | POA: Diagnosis not present

## 2022-08-01 NOTE — Progress Notes (Signed)
   DOS: L5/S1 ALIF/PSF 10/26/21  HISTORY OF PRESENT ILLNESS: 08/01/2022 [name] is status post anterior lumbar interbody fusion with posterior fusion.  Good and bad days.  He is currently not having much pain, but had nerve pain the last couple of days at times.  He feels like he is slowly improving.    PHYSICAL EXAMINATION:   Vitals:   08/01/22 0940  BP: (!) 158/94  Pulse: 75  Temp: 98.2 F (36.8 C)    General: Patient is well developed, well nourished, calm, collected, and in no apparent distress.  NEUROLOGICAL:  General: In no acute distress.  Awake, alert, oriented to person, place, and time. Pupils equal round and reactive to light.   Strength:  Side Iliopsoas Quads Hamstring PF DF EHL  R 5 5 5 5 5 5   L 5 5 5 5 5 5    Incision c/d/i   ROS (Neurologic): Negative except as noted above  IMAGING: No complications noted  ASSESSMENT/PLAN:  Shawn Medina is doing well after anterior lumbar interbody fusion with posterior fusion.  He continues to make slow improvements.  I released him to go back to exercising.  I will see him back on an as-needed basis.    I spent a total of 10 minutes in face-to-face and non-face-to-face activities related to this patient's care today.   Meade Maw MD, [facility] Department of Neurosurgery

## 2023-04-21 ENCOUNTER — Emergency Department: Payer: No Typology Code available for payment source

## 2023-04-21 ENCOUNTER — Other Ambulatory Visit: Payer: Self-pay

## 2023-04-21 ENCOUNTER — Encounter: Payer: Self-pay | Admitting: Emergency Medicine

## 2023-04-21 DIAGNOSIS — Z885 Allergy status to narcotic agent status: Secondary | ICD-10-CM

## 2023-04-21 DIAGNOSIS — F32A Depression, unspecified: Secondary | ICD-10-CM | POA: Diagnosis present

## 2023-04-21 DIAGNOSIS — Z833 Family history of diabetes mellitus: Secondary | ICD-10-CM

## 2023-04-21 DIAGNOSIS — Z9109 Other allergy status, other than to drugs and biological substances: Secondary | ICD-10-CM

## 2023-04-21 DIAGNOSIS — I10 Essential (primary) hypertension: Secondary | ICD-10-CM | POA: Diagnosis present

## 2023-04-21 DIAGNOSIS — L03113 Cellulitis of right upper limb: Principal | ICD-10-CM | POA: Diagnosis present

## 2023-04-21 DIAGNOSIS — G43909 Migraine, unspecified, not intractable, without status migrainosus: Secondary | ICD-10-CM | POA: Diagnosis present

## 2023-04-21 DIAGNOSIS — M7989 Other specified soft tissue disorders: Secondary | ICD-10-CM | POA: Diagnosis present

## 2023-04-21 DIAGNOSIS — F419 Anxiety disorder, unspecified: Secondary | ICD-10-CM | POA: Diagnosis present

## 2023-04-21 DIAGNOSIS — Z981 Arthrodesis status: Secondary | ICD-10-CM

## 2023-04-21 DIAGNOSIS — Z79899 Other long term (current) drug therapy: Secondary | ICD-10-CM

## 2023-04-21 DIAGNOSIS — G4733 Obstructive sleep apnea (adult) (pediatric): Secondary | ICD-10-CM | POA: Diagnosis present

## 2023-04-21 DIAGNOSIS — W5501XA Bitten by cat, initial encounter: Secondary | ICD-10-CM

## 2023-04-21 DIAGNOSIS — G629 Polyneuropathy, unspecified: Secondary | ICD-10-CM | POA: Diagnosis present

## 2023-04-21 DIAGNOSIS — F329 Major depressive disorder, single episode, unspecified: Secondary | ICD-10-CM | POA: Diagnosis present

## 2023-04-21 NOTE — ED Triage Notes (Signed)
Pt in with cat bite sustained late last night, from his own vaccinated cat. Pt has 6 puncture marks present to R wrist, some pus drainage noted. Denies any fevers

## 2023-04-22 ENCOUNTER — Inpatient Hospital Stay
Admission: EM | Admit: 2023-04-22 | Discharge: 2023-04-23 | DRG: 603 | Disposition: A | Discharge status: Home / Self Care | Payer: No Typology Code available for payment source | Attending: Family Medicine | Admitting: Family Medicine

## 2023-04-22 DIAGNOSIS — L03113 Cellulitis of right upper limb: Secondary | ICD-10-CM | POA: Diagnosis present

## 2023-04-22 DIAGNOSIS — Z885 Allergy status to narcotic agent status: Secondary | ICD-10-CM | POA: Diagnosis not present

## 2023-04-22 DIAGNOSIS — G43909 Migraine, unspecified, not intractable, without status migrainosus: Secondary | ICD-10-CM | POA: Diagnosis present

## 2023-04-22 DIAGNOSIS — G43009 Migraine without aura, not intractable, without status migrainosus: Secondary | ICD-10-CM | POA: Diagnosis not present

## 2023-04-22 DIAGNOSIS — G4733 Obstructive sleep apnea (adult) (pediatric): Secondary | ICD-10-CM | POA: Diagnosis present

## 2023-04-22 DIAGNOSIS — F329 Major depressive disorder, single episode, unspecified: Secondary | ICD-10-CM | POA: Diagnosis present

## 2023-04-22 DIAGNOSIS — F32A Depression, unspecified: Secondary | ICD-10-CM | POA: Diagnosis present

## 2023-04-22 DIAGNOSIS — W5501XA Bitten by cat, initial encounter: Secondary | ICD-10-CM | POA: Diagnosis not present

## 2023-04-22 DIAGNOSIS — I10 Essential (primary) hypertension: Secondary | ICD-10-CM | POA: Diagnosis present

## 2023-04-22 DIAGNOSIS — Z79899 Other long term (current) drug therapy: Secondary | ICD-10-CM | POA: Diagnosis not present

## 2023-04-22 DIAGNOSIS — Z833 Family history of diabetes mellitus: Secondary | ICD-10-CM | POA: Diagnosis not present

## 2023-04-22 DIAGNOSIS — G43E09 Chronic migraine with aura, not intractable, without status migrainosus: Secondary | ICD-10-CM | POA: Diagnosis not present

## 2023-04-22 DIAGNOSIS — G629 Polyneuropathy, unspecified: Secondary | ICD-10-CM

## 2023-04-22 DIAGNOSIS — F419 Anxiety disorder, unspecified: Secondary | ICD-10-CM | POA: Diagnosis present

## 2023-04-22 DIAGNOSIS — Z9109 Other allergy status, other than to drugs and biological substances: Secondary | ICD-10-CM | POA: Diagnosis not present

## 2023-04-22 DIAGNOSIS — M7989 Other specified soft tissue disorders: Secondary | ICD-10-CM | POA: Diagnosis present

## 2023-04-22 DIAGNOSIS — Z981 Arthrodesis status: Secondary | ICD-10-CM | POA: Diagnosis not present

## 2023-04-22 LAB — BASIC METABOLIC PANEL
Anion gap: 11 (ref 5–15)
Anion gap: 8 (ref 5–15)
BUN: 15 mg/dL (ref 6–20)
BUN: 15 mg/dL (ref 6–20)
CO2: 23 mmol/L (ref 22–32)
CO2: 25 mmol/L (ref 22–32)
Calcium: 9.1 mg/dL (ref 8.9–10.3)
Calcium: 8.4 mg/dL — ABNORMAL LOW (ref 8.9–10.3)
Chloride: 103 mmol/L (ref 98–111)
Chloride: 105 mmol/L (ref 98–111)
Creatinine, Ser: 0.78 mg/dL (ref 0.61–1.24)
Creatinine, Ser: 0.77 mg/dL (ref 0.61–1.24)
GFR, Estimated: >60 mL/min (ref >60)
GFR, Estimated: >60 mL/min (ref >60)
Glucose, Bld: 93 mg/dL (ref 70–99)
Glucose, Bld: 91 mg/dL (ref 70–99)
Potassium: 3.6 mmol/L (ref 3.5–5.1)
Potassium: 3.3 mmol/L — ABNORMAL LOW (ref 3.5–5.1)
Sodium: 137 mmol/L (ref 135–145)
Sodium: 138 mmol/L (ref 135–145)

## 2023-04-22 LAB — CBC WITH DIFFERENTIAL/PLATELET
Abs Immature Granulocytes: 0.05 10*3/uL (ref 0.00–0.07)
Basophils Absolute: 0 10*3/uL (ref 0.0–0.1)
Basophils Relative: 0 %
Eosinophils Absolute: 0.2 10*3/uL (ref 0.0–0.5)
Eosinophils Relative: 1 %
HCT: 43.2 % (ref 39.0–52.0)
Hemoglobin: 14.8 g/dL (ref 13.0–17.0)
Immature Granulocytes: 0 %
Lymphocytes Relative: 19 %
Lymphs Abs: 2.7 10*3/uL (ref 0.7–4.0)
MCH: 30.3 pg (ref 26.0–34.0)
MCHC: 34.3 g/dL (ref 30.0–36.0)
MCV: 88.5 fL (ref 80.0–100.0)
Monocytes Absolute: 1.3 10*3/uL — ABNORMAL HIGH (ref 0.1–1.0)
Monocytes Relative: 10 %
Neutro Abs: 9.8 10*3/uL — ABNORMAL HIGH (ref 1.7–7.7)
Neutrophils Relative %: 70 %
Platelets: 220 10*3/uL (ref 150–400)
RBC: 4.88 MIL/uL (ref 4.22–5.81)
RDW: 12.2 % (ref 11.5–15.5)
WBC: 14 10*3/uL — ABNORMAL HIGH (ref 4.0–10.5)
nRBC: 0 % (ref 0.0–0.2)

## 2023-04-22 LAB — CBC
HCT: 38.9 % — ABNORMAL LOW (ref 39.0–52.0)
Hemoglobin: 13.6 g/dL (ref 13.0–17.0)
MCH: 31.1 pg (ref 26.0–34.0)
MCHC: 35 g/dL (ref 30.0–36.0)
MCV: 89 fL (ref 80.0–100.0)
Platelets: 193 10*3/uL (ref 150–400)
RBC: 4.37 MIL/uL (ref 4.22–5.81)
RDW: 12.1 % (ref 11.5–15.5)
WBC: 13.3 10*3/uL — ABNORMAL HIGH (ref 4.0–10.5)
nRBC: 0 % (ref 0.0–0.2)

## 2023-04-22 LAB — HIV ANTIBODY (ROUTINE TESTING W REFLEX): HIV Screen 4th Generation wRfx: NONREACTIVE

## 2023-04-22 LAB — MAGNESIUM: Magnesium: 2 mg/dL (ref 1.7–2.4)

## 2023-04-22 LAB — LACTIC ACID, PLASMA: Lactic Acid, Venous: 1.1 mmol/L (ref 0.5–1.9)

## 2023-04-22 MED ORDER — SUMATRIPTAN SUCCINATE 50 MG PO TABS
100.0000 mg | ORAL_TABLET | Freq: Every day | ORAL | Status: DC | PRN
  Administered 2023-04-22: 100 mg via ORAL
  Filled 2023-04-22 (×2): qty 2

## 2023-04-22 MED ORDER — ONDANSETRON HCL 4 MG PO TABS: 4.0000 mg | ORAL_TABLET | Freq: Four times a day (QID) | ORAL | Status: DC | PRN

## 2023-04-22 MED ORDER — TRAZODONE HCL 50 MG PO TABS: 25.0000 mg | ORAL_TABLET | Freq: Every evening | ORAL | Status: DC | PRN

## 2023-04-22 MED ORDER — ONDANSETRON HCL 4 MG/2ML IJ SOLN: 4.0000 mg | Freq: Four times a day (QID) | INTRAMUSCULAR | Status: DC | PRN

## 2023-04-22 MED ORDER — TOPIRAMATE 25 MG PO TABS: 75.0000 mg | ORAL_TABLET | Freq: Every day | ORAL | Status: DC

## 2023-04-22 MED ORDER — SODIUM CHLORIDE 0.9 % IV SOLN
3.0000 g | Freq: Four times a day (QID) | INTRAVENOUS | Status: DC
  Administered 2023-04-22 – 2023-04-23 (×5): 3 g via INTRAVENOUS
  Filled 2023-04-22 (×5): qty 8

## 2023-04-22 MED ORDER — GABAPENTIN 300 MG PO CAPS: 300.0000 mg | ORAL_CAPSULE | Freq: Every day | ORAL | Status: DC | PRN

## 2023-04-22 MED ORDER — TOPIRAMATE 25 MG PO TABS: 100.0000 mg | ORAL_TABLET | Freq: Every day | ORAL | Status: DC

## 2023-04-22 MED ORDER — POTASSIUM CHLORIDE CRYS ER 20 MEQ PO TBCR
40.0000 meq | EXTENDED_RELEASE_TABLET | Freq: Once | ORAL | Status: AC
  Administered 2023-04-22: 40 meq via ORAL
  Filled 2023-04-22: qty 2

## 2023-04-22 MED ORDER — ENOXAPARIN SODIUM 60 MG/0.6ML IJ SOSY
0.5000 mg/kg | PREFILLED_SYRINGE | INTRAMUSCULAR | Status: DC
  Administered 2023-04-22 – 2023-04-23 (×2): 50 mg via SUBCUTANEOUS
  Filled 2023-04-22 (×2): qty 0.6

## 2023-04-22 MED ORDER — SODIUM CHLORIDE 0.9 % IV SOLN: 3.0000 g | Freq: Four times a day (QID) | INTRAVENOUS | Status: DC

## 2023-04-22 MED ORDER — ACETAMINOPHEN 650 MG RE SUPP: 650.0000 mg | Freq: Four times a day (QID) | RECTAL | Status: DC | PRN

## 2023-04-22 MED ORDER — SODIUM CHLORIDE 0.9 % IV SOLN
3.0000 g | INTRAVENOUS | Status: AC
  Administered 2023-04-22: 3 g via INTRAVENOUS
  Filled 2023-04-22: qty 8

## 2023-04-22 MED ORDER — TOPIRAMATE 25 MG PO TABS
25.0000 mg | ORAL_TABLET | Freq: Every day | ORAL | Status: DC
  Administered 2023-04-22: 25 mg via ORAL
  Filled 2023-04-22: qty 1

## 2023-04-22 MED ORDER — MORPHINE SULFATE (PF) 2 MG/ML IV SOLN: 2.0000 mg | INTRAVENOUS | Status: DC | PRN

## 2023-04-22 MED ORDER — DEXAMETHASONE SODIUM PHOSPHATE 10 MG/ML IJ SOLN
10.0000 mg | Freq: Once | INTRAMUSCULAR | Status: AC
  Administered 2023-04-22: 10 mg via INTRAVENOUS
  Filled 2023-04-22: qty 1

## 2023-04-22 MED ORDER — ACETAMINOPHEN 325 MG PO TABS
650.0000 mg | ORAL_TABLET | Freq: Four times a day (QID) | ORAL | Status: DC | PRN
  Administered 2023-04-22: 650 mg via ORAL
  Filled 2023-04-22: qty 2

## 2023-04-22 MED ORDER — TOPIRAMATE 25 MG PO TABS: 25.0000 mg | ORAL_TABLET | ORAL | Status: DC

## 2023-04-22 MED ORDER — MAGNESIUM HYDROXIDE 400 MG/5ML PO SUSP: 30.0000 mL | Freq: Every day | ORAL | Status: DC | PRN

## 2023-04-22 MED ORDER — TOPIRAMATE 25 MG PO TABS: 50.0000 mg | ORAL_TABLET | Freq: Every day | ORAL | Status: DC

## 2023-04-22 MED ORDER — TRAZODONE HCL 50 MG PO TABS
50.0000 mg | ORAL_TABLET | Freq: Every day | ORAL | Status: DC
  Administered 2023-04-22: 50 mg via ORAL
  Filled 2023-04-22: qty 1

## 2023-04-22 MED ORDER — SODIUM CHLORIDE 0.9 % IV SOLN: 2.0000 g | INTRAVENOUS | Status: DC

## 2023-04-22 MED ORDER — MORPHINE SULFATE (PF) 4 MG/ML IV SOLN
4.0000 mg | Freq: Once | INTRAVENOUS | Status: AC
  Administered 2023-04-22: 4 mg via INTRAVENOUS
  Filled 2023-04-22: qty 1

## 2023-04-22 MED ORDER — SERTRALINE HCL 50 MG PO TABS
100.0000 mg | ORAL_TABLET | Freq: Every day | ORAL | Status: DC
  Administered 2023-04-22 – 2023-04-23 (×2): 100 mg via ORAL
  Filled 2023-04-22 (×2): qty 2

## 2023-04-22 NOTE — Assessment & Plan Note (Signed)
-   We will continue with Zoloft.

## 2023-04-22 NOTE — Assessment & Plan Note (Signed)
-   The patient will be admitted to a medical bed. - Will continue antibiotic therapy with IV Unasyn. - Warm compresses will be utilized. - Pain management will be provided. - Orthopedic consult will be obtained. - I notified Dr. Hyacinth Meeker about the patient.

## 2023-04-22 NOTE — Assessment & Plan Note (Signed)
-   He has had back surgery in the past and has been having radiculopathy. - We will continue his Neurontin.

## 2023-04-22 NOTE — Consult Note (Signed)
ORTHOPAEDIC CONSULTATION  REQUESTING PHYSICIAN: Arnetha Courser, MD  Chief Complaint: Pain and swelling right wrist  HPI: Shawn Medina is a 32 y.o. male who complains of pain and swelling and redness of the right wrist.  He suffered a cat bite several days ago and does developed redness and swelling in cellulitis over the dorsum of the right wrist and forearm.  He was presented to the emergency room last night.  He was started on antibiotic IV antibiotics.  His initial white count was 14,000 and it is down to 13,300 now.  He has been afebrile.  He has moderate pain.  He has good wrist and hand function.  He is not working presently.  Past Medical History:  Diagnosis Date   Anxiety    Depression    Headache    Hypertension    Sleep apnea    Past Surgical History:  Procedure Laterality Date   ABDOMINAL EXPOSURE N/A 10/26/2021   Procedure: ABDOMINAL EXPOSURE;  Surgeon: Annice Needy, MD;  Location: [facility] ORS;  Service: Vascular;  Laterality: N/A;   ANTERIOR AND POSTERIOR SPINAL FUSION N/A 10/26/2021   Procedure: L5-S1 ANTERIOR LATERAL INTERBODY LUMBAR FUSION WITH POSTERIOR SPINAL FUSION;  Surgeon: Venetia Night, MD;  Location: [facility] ORS;  Service: Neurosurgery;  Laterality: N/A;   APPLICATION OF INTRAOPERATIVE CT SCAN N/A 10/26/2021   Procedure: APPLICATION OF INTRAOPERATIVE CT SCAN;  Surgeon: Venetia Night, MD;  Location: [facility] ORS;  Service: Neurosurgery;  Laterality: N/A;   Social History   Socioeconomic History   Marital status: Married    Spouse name: Not on file   Number of children: Not on file   Years of education: Not on file   Highest education level: Not on file  Occupational History   Not on file  Tobacco Use   Smoking status: Never   Smokeless tobacco: Never  Vaping Use   Vaping status: Never Used  Substance and Sexual Activity   Alcohol use: Yes    Comment: occ   Drug use: No   Sexual activity: Not on file  Other Topics Concern   Not on file  Social  History Narrative   Not on file   Social Determinants of Health   Financial Resource Strain: Not on file  Food Insecurity: Not on file  Transportation Needs: Not on file  Physical Activity: Not on file  Stress: Not on file  Social Connections: Not on file   History reviewed. No pertinent family history. Allergies  Allergen Reactions   Soy Allergy Shortness Of Breath   Oxycodone Hcl Nausea And Vomiting    Felt like he was going to die   Prior to Admission medications   Medication Sig Start Date End Date Taking? Authorizing Provider  acetaminophen (TYLENOL) 325 MG tablet Take 650 mg by mouth every 6 (six) hours as needed for mild pain (pain score 1-3) or moderate pain (pain score 4-6).   Yes [provider]  gabapentin (NEURONTIN) 300 MG capsule Take 300 mg by mouth 2 (two) times daily as needed (back pain).   Yes [provider]  ibuprofen (ADVIL) 200 MG tablet Take 400-600 mg by mouth every 6 (six) hours as needed for mild pain (pain score 1-3) or moderate pain (pain score 4-6).   Yes [provider]  sertraline (ZOLOFT) 100 MG tablet Take 200 mg by mouth daily.   Yes [provider]  SUMAtriptan (IMITREX) 100 MG tablet Take 100 mg by mouth daily as needed for migraine. (May  take 1 additional tablet after 2 hours if needed)   Yes [provider]  traZODone (DESYREL) 100 MG tablet Take 100 mg by mouth at bedtime.   Yes [provider]  topiramate (TOPAMAX) 25 MG tablet Take 25-100 mg by mouth See admin instructions. Take 1 tablet (25mg ) by mouth every night at bedtime for one week, then take 2 tablets (50mg ) by mouth every night at bedtime for one week, then take 3 tablets (75mg ) by mouth every night at bedtime for one week then take 4 tablets (100mg ) by mouth every night thereafter    [provider]   DG Forearm Right  Result Date: 04/21/2023 CLINICAL DATA:  Recent cat bite with drainage, initial encounter EXAM: RIGHT  FOREARM - 2 VIEW COMPARISON:  None Available. FINDINGS: There is no evidence of fracture or other focal bone lesions. Soft tissues are unremarkable. IMPRESSION: No acute abnormality noted. Electronically Signed   By: Alcide Clever M.D.   On: 04/21/2023 21:50    Positive ROS: All other systems have been reviewed and were otherwise negative with the exception of those mentioned in the HPI and as above.  Physical Exam: General: Alert, no acute distress Cardiovascular: No pedal edema Respiratory: No cyanosis, no use of accessory musculature GI: No organomegaly, abdomen is soft and non-tender Skin: No lesions in the area of chief complaint Neurologic: Sensation intact distally Psychiatric: Patient is competent for consent with normal mood and affect Lymphatic: No axillary or cervical lymphadenopathy  MUSCULOSKELETAL: Patient is alert awake lying comfortably on the stretcher.  The right wrist and distal forearm shows some cellulitis on the dorsum without any fluctuance.  There is minimal tenderness.  There are some healed cat bites on the dorsal ulnar aspect of the wrist.  Wrist and hand tendon function is normal.  Neurovascular status is normal.  No other abnormalities are noted.  Assessment: Cellulitis right wrist and forearm  Plan: IV antibiotics x 24 hours then convert to p.o. antibiotics. Follow-up in my office in 2 to 3 days following discharge. No indication for surgical intervention.   Valinda Hoar, MD [phone]   04/22/2023 5:16 PM

## 2023-04-22 NOTE — Progress Notes (Signed)
No charge progress note.  Shawn Medina is a 32 y.o. Caucasian male with medical history significant for anxiety, depression, essential hypertension and OSA, who presented to the ER with acute onset of right wrist and hand swelling and pain that started yesterday after having cat bite from his domestic cat a couple of days ago.   On arrival to ED vitals were stable, labs pertinent for leukocytosis at 14, neutrophilic predominant.  Right forearm x-ray with no acute abnormality.  Patient was started on Unasyn.  10/20: Vital normal.  Labs with small improvement of leukocytosis to 12, potassium 3.3, preliminary blood cultures negative.  Replating potassium, magnesium normal. Patient continued to have left wrist pain but stating it is improving, no worsening of erythema or edema.  Sensation and pulses intact.  Remained afebrile.  Rest of the exam was benign.  We will continue with Unasyn for today and likely be discharged tomorrow on Augmentin for a total of 1 week course.

## 2023-04-22 NOTE — Assessment & Plan Note (Signed)
-   We will continue his Topamax and Imitrex.

## 2023-04-22 NOTE — Hospital Course (Addendum)
Taken from H&P.  Shawn Medina is a 32 y.o. Caucasian male with medical history significant for anxiety, depression, essential hypertension and OSA, who presented to the ER with acute onset of right wrist and hand swelling and pain that started yesterday after having cat bite from his domestic cat a couple of days ago.   On arrival to ED vitals were stable, labs pertinent for leukocytosis at 14, neutrophilic predominant.  Right forearm x-ray with no acute abnormality.  Patient was started on Unasyn.  10/20: Vital normal.  Labs with small improvement of leukocytosis to 12, potassium 3.3, preliminary blood cultures negative.  10/21: Patient remained hemodynamically stable.  Orthopedic also evaluated him and recommended continuation of p.o. antibiotics with Augmentin for total of 1 week.  His right upper extremity was wrapped with Ace wrap.  He need to follow-up with orthopedic surgery in 2 to 3 days for further evaluation.  Patient is being discharged on Augmentin to complete a total of 7 days course of antibiotic.  He will continue the rest of his home medications and follow-up with his providers for further management.

## 2023-04-22 NOTE — ED Notes (Signed)
Urine collected. Sent to lab

## 2023-04-22 NOTE — ED Notes (Signed)
Medication not in pyxis. Pharmacy messaged to send

## 2023-04-22 NOTE — ED Provider Notes (Signed)
[facility] Provider Note    Event Date/Time   First MD Initiated Contact with Patient 04/22/23 0015     (approximate)   History   Animal Bite   HPI Shawn Medina is a 32 y.o. male who presents for evaluation of swelling, pain, and redness after being bitten by his own cat.  The bites occurred about 48 hours ago.  He sustained multiple bites on his right wrist.  He washed them thoroughly and treated them with hydrogen peroxide and was hoping they would not get infected.  However over the last 24 hours the pain and swelling and redness have spread to the back of his hand and part way of his arm.  He has had no fever, nausea, or vomiting but the pain is intense.  He has no numbness nor tingling but there is substantial swelling.  He said that some purulent drainage has come out of a few of the bite wounds although it is not draining at this time.     Physical Exam   Triage Vital Signs: ED Triage Vitals  Encounter Vitals Group     BP 04/21/23 1905 (!) 127/95     Systolic BP Percentile --      Diastolic BP Percentile --      Pulse Rate 04/21/23 1905 75     Resp 04/21/23 1905 18     Temp 04/21/23 1905 98.3 F (36.8 C)     Temp Source 04/21/23 1905 Oral     SpO2 04/21/23 1905 99 %     Weight 04/21/23 1908 101.7 kg (224 lb 3.3 oz)     Height --      Head Circumference --      Peak Flow --      Pain Score 04/21/23 1908 8     Pain Loc --      Pain Education --      Exclude from Growth Chart --     Most recent vital signs: Vitals:   04/21/23 1905  BP: (!) 127/95  Pulse: 75  Resp: 18  Temp: 98.3 F (36.8 C)  SpO2: 99%    General: Awake, no distress.  CV:  Good peripheral perfusion.  Resp:  Normal effort. Speaking easily and comfortably, no accessory muscle usage nor intercostal retractions.   Abd:  No distention.  Other:  Multiple bite wounds to the right wrist on the dorsal aspect.  No active drainage at this time.  It is difficult to  appreciate in the photo below, but the patient has swelling and erythema that extends from the wrist distally to the dorsum of the right hand and proximally about halfway up the right forearm.  Tender to palpation.  Compartments are soft and easily compressible despite the edema.    ED Results / Procedures / Treatments   Labs (all labs ordered are listed, but only abnormal results are displayed) Labs Reviewed  CBC WITH DIFFERENTIAL/PLATELET - Abnormal; Notable for the following components:      Result Value   WBC 14.0 (*)    Neutro Abs 9.8 (*)    Monocytes Absolute 1.3 (*)    All other components within normal limits  CULTURE, BLOOD (ROUTINE X 2)  CULTURE, BLOOD (ROUTINE X 2)  BASIC METABOLIC PANEL  LACTIC ACID, PLASMA      RADIOLOGY I viewed and interpreted the patient's right forearm x-rays and I see no evidence of foreign bodies or bony involvement, just soft tissue swelling.  I  also read the radiologist's report, which confirmed no acute findings.   PROCEDURES:  Critical Care performed: No  Procedures    IMPRESSION / MDM / ASSESSMENT AND PLAN / ED COURSE  I reviewed the triage vital signs and the nursing notes.                              Differential diagnosis includes, but is not limited to, cellulitis, embedded foreign body, compartment syndrome, deep infection such as tenosynovitis.  Patient's presentation is most consistent with acute presentation with potential threat to life or bodily function.  Labs/studies ordered: Blood cultures x 2, lactic acid, CBC with differential, basic metabolic panel, right forearm x-rays  Interventions/Medications given:  Medications  dexamethasone (DECADRON) injection 10 mg (has no administration in time range)  Ampicillin-Sulbactam (UNASYN) 3 g in sodium chloride 0.9 % 100 mL IVPB (0 g Intravenous Stopped 04/22/23 0219)    (Note:  hospital course my include additional interventions and/or labs/studies not listed  above.)   Vital signs are stable but presentation is concerning for rapidly developing cellulitis of right hand, wrist, and forearm due to cat bites.  Ordering Unasyn 3 g IV.  Labs pending as well including blood cultures and lactic acid.  Recommended inpatient treatment but patient is unsure, will think about it while he gets antibiotics.     Clinical Course as of 04/22/23 [id number]  Wynelle Link Apr 22, 2023  0211 Normal lactic acid and metabolic panel, but leukocytosis of 14.0. [CF]  0229 Patient agreed with my recommendation to stay in the hospital for IV antibiotics and monitoring and likely orthopedics consult.  Consulting hospitalist for admission. [CF]  [id number] Consulted with Dr. Arville Care with the hospitalist service who will admit [CF]    Clinical Course User Index [CF] Loleta Rose, MD     FINAL CLINICAL IMPRESSION(S) / ED DIAGNOSES   Final diagnoses:  Cat bite, initial encounter  Right arm cellulitis     Rx / DC Orders   ED Discharge Orders     None        Note:  This document was prepared using Dragon voice recognition software and may include unintentional dictation errors.   Loleta Rose, MD 04/22/23 [phone]

## 2023-04-22 NOTE — H&P (Signed)
[place]   PATIENT NAME: Shawn Medina    MR#:  [id number]  DATE OF BIRTH:  [birth year]  DATE OF ADMISSION:  04/22/2023  PRIMARY CARE PHYSICIAN: [facility]   Patient is coming from: Home  REQUESTING/REFERRING PHYSICIAN: Loleta Rose, MD  CHIEF COMPLAINT:   Chief Complaint  Patient presents with   Animal Bite    HISTORY OF PRESENT ILLNESS:  Shawn Medina is a 32 y.o. Caucasian male with medical history significant for anxiety, depression, essential hypertension and OSA, who presented to the ER with acute onset of right wrist and hand swelling and pain that started yesterday after having cat bite from his domestic cat a couple of days ago.  It started in his right wrist and spread to his hand and proximally to his arm.  He denies any fever or chills.  It has been swelling with associated erythema, induration and tenderness.  No chest pain or dyspnea or cough.  No nausea or vomiting or abdominal pain.  No bleeding diathesis.  ED Course: When he came to the ER, BP was 127/95 with otherwise normal vital signs.  Labs revealed leukocytosis of 14 with neutrophilia and unremarkable BMP.  Blood cultures were drawn. EKG as reviewed by me : None Imaging: Right forearm x-ray showed no acute abnormality.  The patient was given IV Unasyn, 4 mg of IV morphine sulfate and 10 mg of IV Decadron.  He will be admitted to a medical bed for further evaluation and management. PAST MEDICAL HISTORY:   Past Medical History:  Diagnosis Date   Anxiety    Depression    Headache    Hypertension    Sleep apnea     PAST SURGICAL HISTORY:   Past Surgical History:  Procedure Laterality Date   ABDOMINAL EXPOSURE N/A 10/26/2021   Procedure: ABDOMINAL EXPOSURE;  Surgeon: Annice Needy, MD;  Location: [facility] ORS;  Service: Vascular;  Laterality: N/A;   ANTERIOR AND POSTERIOR SPINAL FUSION N/A 10/26/2021   Procedure: L5-S1 ANTERIOR LATERAL INTERBODY LUMBAR FUSION WITH POSTERIOR SPINAL  FUSION;  Surgeon: Venetia Night, MD;  Location: [facility] ORS;  Service: Neurosurgery;  Laterality: N/A;   APPLICATION OF INTRAOPERATIVE CT SCAN N/A 10/26/2021   Procedure: APPLICATION OF INTRAOPERATIVE CT SCAN;  Surgeon: Venetia Night, MD;  Location: [facility] ORS;  Service: Neurosurgery;  Laterality: N/A;    SOCIAL HISTORY:   Social History   Tobacco Use   Smoking status: Never   Smokeless tobacco: Never  Substance Use Topics   Alcohol use: Yes    Comment: occ    FAMILY HISTORY:   Positive for diabetes mellitus.  DRUG ALLERGIES:   Allergies  Allergen Reactions   Soy Allergy Shortness Of Breath   Oxycodone Hcl Nausea And Vomiting    Felt like he was going to die    REVIEW OF SYSTEMS:   ROS As per history of present illness. All pertinent systems were reviewed above. Constitutional, HEENT, cardiovascular, respiratory, GI, GU, musculoskeletal, neuro, psychiatric, endocrine, integumentary and hematologic systems were reviewed and are otherwise negative/unremarkable except for positive findings mentioned above in the HPI.   MEDICATIONS AT HOME:   Prior to Admission medications   Medication Sig Start Date End Date Taking? Authorizing Provider  gabapentin (NEURONTIN) 300 MG capsule Take 300 mg by mouth daily as needed (back pain). 03/03/21   [provider]  ondansetron (ZOFRAN-ODT) 4 MG disintegrating tablet Take 1 tablet (4 mg total) by mouth every 6 (six) hours  as needed for nausea or vomiting. 06/10/21   Ward, Layla Maw, DO  sertraline (ZOLOFT) 100 MG tablet TAKE TWO TABLETS BY MOUTH EVERY DAY FOR DEPRESSION 02/10/22 05/20/23  [provider]  traZODone (DESYREL) 50 MG tablet TAKE ONE-HALF TABLET BY MOUTH AT BEDTIME FOR SLEEP 03/10/22 03/11/23  [provider]      VITAL SIGNS:  Blood pressure (!) 127/95, pulse 75, temperature 98.3 F (36.8 C), temperature source Oral, resp. rate 18, height 5\' 8"  (1.727 m), weight 101.7 kg, SpO2 99%.  PHYSICAL  EXAMINATION:  Physical Exam  GENERAL:  32 y.o.-year-old patient lying in the bed with no acute distress.  EYES: Pupils equal, round, reactive to light and accommodation. No scleral icterus. Extraocular muscles intact.  HEENT: Head atraumatic, normocephalic. Oropharynx and nasopharynx clear.  NECK:  Supple, no jugular venous distention. No thyroid enlargement, no tenderness.  LUNGS: Normal breath sounds bilaterally, no wheezing, rales,rhonchi or crepitation. No use of accessory muscles of respiration.  CARDIOVASCULAR: Regular rate and rhythm, S1, S2 normal. No murmurs, rubs, or gallops.  ABDOMEN: Soft, nondistended, nontender. Bowel sounds present. No organomegaly or mass.  EXTREMITIES: No pedal edema, cyanosis, or clubbing.  NEUROLOGIC: Cranial nerves II through XII are intact. Muscle strength 5/5 in all extremities. Sensation intact. Gait not checked.  PSYCHIATRIC: The patient is alert and oriented x 3.  Normal affect and good eye contact. SKIN: Right wrist several puncture wounds with surrounding erythema and induration extending to the dorsum of his hand as well as proximal forearm with associated warmth and tenderness without fluctuation.      LABORATORY PANEL:   CBC Recent Labs  Lab 04/22/23 0324  WBC 13.3*  HGB 13.6  HCT 38.9*  PLT 193   ------------------------------------------------------------------------------------------------------------------  Chemistries  Recent Labs  Lab 04/22/23 0324  NA 138  K 3.3*  CL 105  CO2 25  GLUCOSE 91  BUN 15  CREATININE 0.77  CALCIUM 8.4*   ------------------------------------------------------------------------------------------------------------------  Cardiac Enzymes No results for input(s): "TROPONINI" in the last 168 hours. ------------------------------------------------------------------------------------------------------------------  RADIOLOGY:  DG Forearm Right  Result Date: 04/21/2023 CLINICAL DATA:  Recent  cat bite with drainage, initial encounter EXAM: RIGHT FOREARM - 2 VIEW COMPARISON:  None Available. FINDINGS: There is no evidence of fracture or other focal bone lesions. Soft tissues are unremarkable. IMPRESSION: No acute abnormality noted. Electronically Signed   By: Alcide Clever M.D.   On: 04/21/2023 21:50      IMPRESSION AND PLAN:  Assessment and Plan: * Cellulitis of right upper extremity - The patient will be admitted to a medical bed. - Will continue antibiotic therapy with IV Unasyn. - Warm compresses will be utilized. - Pain management will be provided. - Orthopedic consult will be obtained. - I notified Dr. Hyacinth Meeker about the patient.  Major depressive disorder - We will continue with Zoloft.  Peripheral neuropathy - He has had back surgery in the past and has been having radiculopathy. - We will continue his Neurontin.  Migraine, unspecified, not intractable, without status migrainosus - We will continue his Topamax and Imitrex.    DVT prophylaxis: Lovenox.  Advanced Care Planning:  Code Status: full code.  Family Communication:  The plan of care was discussed in details with the patient (and family). I answered all questions. The patient agreed to proceed with the above mentioned plan. Further management will depend upon hospital course. Disposition Plan: Back to previous home environment Consults called: Orthopedic consult All the records are reviewed and case discussed with ED  provider.  Status is: Inpatient  At the time of the admission, it appears that the appropriate admission status for this patient is inpatient.  This is judged to be reasonable and necessary in order to provide the required intensity of service to ensure the patient's safety given the presenting symptoms, physical exam findings and initial radiographic and laboratory data in the context of comorbid conditions.  The patient requires inpatient status due to high intensity of service, high risk of  further deterioration and high frequency of surveillance required.  I certify that at the time of admission, it is my clinical judgment that the patient will require inpatient hospital care extending more than 2 midnights.                            Dispo: The patient is from: Home              Anticipated d/c is to: Home              Patient currently is not medically stable to d/c.              Difficult to place patient: No  Hannah Beat M.D on 04/22/2023 at 5:24 AM  [facility]   From 7 PM-7 AM, contact night-coverage [email or web address]  CC: Primary care physician; [facility]

## 2023-04-23 DIAGNOSIS — G43E09 Chronic migraine with aura, not intractable, without status migrainosus: Secondary | ICD-10-CM

## 2023-04-23 DIAGNOSIS — L03113 Cellulitis of right upper limb: Secondary | ICD-10-CM | POA: Diagnosis not present

## 2023-04-23 DIAGNOSIS — W5501XA Bitten by cat, initial encounter: Secondary | ICD-10-CM | POA: Diagnosis not present

## 2023-04-23 MED ORDER — AMOXICILLIN-POT CLAVULANATE 875-125 MG PO TABS: 1.0000 | ORAL_TABLET | Freq: Two times a day (BID) | ORAL | 0 refills | Status: AC

## 2023-04-23 NOTE — ED Notes (Signed)
Patient resting comfortably with eyes closed at this time. Respirations even and unlabored. NAD

## 2023-04-23 NOTE — Discharge Summary (Signed)
Physician Discharge Summary   Patient: Shawn Medina MRN: [id number] DOB: 28-Jun-1991  Admit date:     04/22/2023  Discharge date: 04/23/23  Discharge Physician: Arnetha Courser   PCP: [facility]   Recommendations at discharge:  Follow-up with orthopedic surgery Follow-up with primary care provider Please ensure that he complete the course of antibiotics  Discharge Diagnoses: Principal Problem:   Cellulitis of right upper extremity Active Problems:   Migraine, unspecified, not intractable, without status migrainosus   Peripheral neuropathy   Major depressive disorder   Cat bite   Hospital Course: Taken from H&P.  Shawn Medina is a 32 y.o. Caucasian male with medical history significant for anxiety, depression, essential hypertension and OSA, who presented to the ER with acute onset of right wrist and hand swelling and pain that started yesterday after having cat bite from his domestic cat a couple of days ago.   On arrival to ED vitals were stable, labs pertinent for leukocytosis at 14, neutrophilic predominant.  Right forearm x-ray with no acute abnormality.  Patient was started on Unasyn.  10/20: Vital normal.  Labs with small improvement of leukocytosis to 12, potassium 3.3, preliminary blood cultures negative.  10/21: Patient remained hemodynamically stable.  Orthopedic also evaluated him and recommended continuation of p.o. antibiotics with Augmentin for total of 1 week.  His right upper extremity was wrapped with Ace wrap.  He need to follow-up with orthopedic surgery in 2 to 3 days for further evaluation.  Patient is being discharged on Augmentin to complete a total of 7 days course of antibiotic.  He will continue the rest of his home medications and follow-up with his providers for further management.  Assessment and Plan: * Cellulitis of right upper extremity Orthopedic was also consulted and there is no need for any intervention and they were  recommending outpatient follow-up.  Patient received Unasyn while in the hospital and discharged on Augmentin to complete a 1 week course.  Major depressive disorder - We will continue with Zoloft.  Peripheral neuropathy - He has had back surgery in the past and has been having radiculopathy. - We will continue his Neurontin.  Migraine, unspecified, not intractable, without status migrainosus - We will continue his Topamax and Imitrex.   Consultants: Orthopedic surgery Procedures performed: None Disposition: Home Diet recommendation:  Discharge Diet Orders (From admission, onward)     Start     Ordered   04/23/23 0000  Diet - low sodium heart healthy        04/23/23 0951           Regular diet DISCHARGE MEDICATION: Allergies as of 04/23/2023       Reactions   Soy Allergy Shortness Of Breath   Oxycodone Hcl Nausea And Vomiting   Felt like he was going to die        Medication List     TAKE these medications    acetaminophen 325 MG tablet Commonly known as: TYLENOL Take 650 mg by mouth every 6 (six) hours as needed for mild pain (pain score 1-3) or moderate pain (pain score 4-6).   amoxicillin-clavulanate 875-125 MG tablet Commonly known as: AUGMENTIN Take 1 tablet by mouth 2 (two) times daily for 6 days.   gabapentin 300 MG capsule Commonly known as: NEURONTIN Take 300 mg by mouth 2 (two) times daily as needed (back pain).   ibuprofen 200 MG tablet Commonly known as: ADVIL Take 400-600 mg by mouth every 6 (six) hours as  needed for mild pain (pain score 1-3) or moderate pain (pain score 4-6).   sertraline 100 MG tablet Commonly known as: ZOLOFT Take 200 mg by mouth daily.   SUMAtriptan 100 MG tablet Commonly known as: IMITREX Take 100 mg by mouth daily as needed for migraine. (May take 1 additional tablet after 2 hours if needed)   topiramate 25 MG tablet Commonly known as: TOPAMAX Take 25-100 mg by mouth See admin instructions. Take 1 tablet  (25mg ) by mouth every night at bedtime for one week, then take 2 tablets (50mg ) by mouth every night at bedtime for one week, then take 3 tablets (75mg ) by mouth every night at bedtime for one week then take 4 tablets (100mg ) by mouth every night thereafter   traZODone 100 MG tablet Commonly known as: DESYREL Take 100 mg by mouth at bedtime.        Follow-up Information     [facility]. Schedule an appointment as soon as possible for a visit in 1 week(s).   Specialty: General Practice Contact information: [address] [place] [address] [phone]         Deeann Saint, MD Follow up in 3 day(s).   Specialty: Orthopedic Surgery Contact information: [address] [place] [address] [phone]                Discharge Exam: Ceasar Mons Weights   04/21/23 1908  Weight: 101.7 kg   General.  Obese gentleman, in no acute distress. Pulmonary.  Lungs clear bilaterally, normal respiratory effort. CV.  Regular rate and rhythm, no JVD, rub or murmur. Abdomen.  Soft, nontender, nondistended, BS positive. CNS.  Alert and oriented .  No focal neurologic deficit. Extremities.  No edema, no cyanosis, pulses intact and symmetrical.  Right upper extremity with Ace wrap Psychiatry.  Judgment and insight appears normal.   Condition at discharge: stable  The results of significant diagnostics from this hospitalization (including imaging, microbiology, ancillary and laboratory) are listed below for reference.   Imaging Studies: DG Forearm Right  Result Date: 04/21/2023 CLINICAL DATA:  Recent cat bite with drainage, initial encounter EXAM: RIGHT FOREARM - 2 VIEW COMPARISON:  None Available. FINDINGS: There is no evidence of fracture or other focal bone lesions. Soft tissues are unremarkable. IMPRESSION: No acute abnormality noted. Electronically Signed   By: Alcide Clever M.D.   On: 04/21/2023 21:50    Microbiology: Results for orders placed or performed  during the hospital encounter of 04/22/23  Blood Culture (routine x 2)     Status: None (Preliminary result)   Collection Time: 04/22/23  1:26 AM   Specimen: BLOOD  Result Value Ref Range Status   Specimen Description BLOOD BLOOD LEFT HAND  Final   Special Requests   Final    BOTTLES DRAWN AEROBIC AND ANAEROBIC Blood Culture adequate volume   Culture   Final    NO GROWTH 1 DAY Performed at [facility], [address] [place], [place] [address]    Report Status PENDING  Incomplete  Blood Culture (routine x 2)     Status: None (Preliminary result)   Collection Time: 04/22/23  1:34 AM   Specimen: BLOOD  Result Value Ref Range Status   Specimen Description BLOOD RIGHT ANTECUBITAL  Final   Special Requests   Final    BOTTLES DRAWN AEROBIC AND ANAEROBIC Blood Culture adequate volume   Culture   Final    NO GROWTH 1 DAY Performed at [facility],  [address], [place], [place] [address]    Report Status PENDING  Incomplete    Labs: CBC: Recent Labs  Lab 04/22/23 0119 04/22/23 0324  WBC 14.0* 13.3*  NEUTROABS 9.8*  --   HGB 14.8 13.6  HCT 43.2 38.9*  MCV 88.5 89.0  PLT 220 193   Basic Metabolic Panel: Recent Labs  Lab 04/22/23 0119 04/22/23 0324  NA 137 138  K 3.6 3.3*  CL 103 105  CO2 23 25  GLUCOSE 93 91  BUN 15 15  CREATININE 0.78 0.77  CALCIUM 9.1 8.4*  MG  --  2.0   Liver Function Tests: No results for input(s): "AST", "ALT", "ALKPHOS", "BILITOT", "PROT", "ALBUMIN" in the last 168 hours. CBG: No results for input(s): "GLUCAP" in the last 168 hours.  Discharge time spent: greater than 30 minutes.  This record has been created using Conservation officer, historic buildings. Errors have been sought and corrected,but may not always be located. Such creation errors do not reflect on the standard of care.   Signed: Arnetha Courser, MD [facility] 04/23/2023

## 2023-04-23 NOTE — ED Notes (Signed)
Patient is resting comfortably. 

## 2023-04-27 LAB — CULTURE, BLOOD (ROUTINE X 2)
Culture: NO GROWTH
Culture: NO GROWTH
Special Requests: ADEQUATE
Special Requests: ADEQUATE

## 2023-05-03 ENCOUNTER — Ambulatory Visit: Payer: No Typology Code available for payment source | Admitting: Neurosurgery

## 2023-05-10 ENCOUNTER — Ambulatory Visit: Payer: No Typology Code available for payment source | Admitting: Neurosurgery

## 2023-05-10 ENCOUNTER — Encounter: Payer: Self-pay | Admitting: Neurosurgery

## 2023-05-10 VITALS — BP 118/84 | Ht 68.0 in | Wt 224.0 lb

## 2023-05-10 DIAGNOSIS — M545 Low back pain, unspecified: Secondary | ICD-10-CM | POA: Diagnosis not present

## 2023-05-10 DIAGNOSIS — G8929 Other chronic pain: Secondary | ICD-10-CM | POA: Diagnosis not present

## 2023-05-10 NOTE — Progress Notes (Signed)
   DOS: L5/S1 ALIF/PSF 10/26/21  HISTORY OF PRESENT ILLNESS: 05/10/2023 Shawn Medina returns to see me.  He is having significant low back pain that seems mostly related to activity.  He has approximately 1 bad day per week that usually occurs after increased house or yard work.  He also describes some leg numbness with sitting.  08/01/2022 [name] is status post anterior lumbar interbody fusion with posterior fusion.  Good and bad days.  He is currently not having much pain, but had nerve pain the last couple of days at times.  He feels like he is slowly improving.    PHYSICAL EXAMINATION:   Vitals:   05/10/23 0938  BP: 118/84    General: Patient is well developed, well nourished, calm, collected, and in no apparent distress.  NEUROLOGICAL:  General: In no acute distress.  Awake, alert, oriented to person, place, and time. Pupils equal round and reactive to light.   Strength:  Side Iliopsoas Quads Hamstring PF DF EHL  R 5 5 5 5 5 5   L 5 5 5 5 5 5    Incision c/d/i   ROS (Neurologic): Negative except as noted above  IMAGING: No complications noted on prior imaging  ASSESSMENT/PLAN:  Shawn Medina is doing well after anterior lumbar interbody fusion with posterior fusion.  His pain level is much improved compared to before surgery.  He is having pain exacerbations with activity.  I have recommended some physical therapy to help with his back discomfort.  This may also help him modify his activities so that he does not have his of any exacerbations.  I will touch base with him via telephone in 2 months.    I spent a total of 10 minutes in face-to-face and non-face-to-face activities related to this patient's care today.   Venetia Night MD, [facility] Department of Neurosurgery

## 2023-07-05 ENCOUNTER — Ambulatory Visit: Payer: No Typology Code available for payment source | Admitting: Neurosurgery

## 2023-07-05 DIAGNOSIS — G8929 Other chronic pain: Secondary | ICD-10-CM

## 2023-07-05 DIAGNOSIS — M545 Low back pain, unspecified: Secondary | ICD-10-CM

## 2023-07-05 NOTE — Progress Notes (Signed)
   DOS: L5/S1 ALIF/PSF 10/26/21  HISTORY OF PRESENT ILLNESS: 07/05/2023 He is doing much better than when I saw him 2 months ago. He has not started PT.   05/10/2023 Mr. Obremski returns to see me.  He is having significant low back pain that seems mostly related to activity.  He has approximately 1 bad day per week that usually occurs after increased house or yard work.  He also describes some leg numbness with sitting.  08/01/2022 Mr. Cruzito Depaz is status post anterior lumbar interbody fusion with posterior fusion.  Good and bad days.  He is currently not having much pain, but had nerve pain the last couple of days at times.  He feels like he is slowly improving.    PHYSICAL EXAMINATION:  Telephone visit   ROS (Neurologic): Negative except as noted above  IMAGING: No complications noted on prior imaging  ASSESSMENT/PLAN:  Nieko Stafford is doing well after anterior lumbar interbody fusion with posterior fusion.    [facility]  I will see him back on an as-needed basis.    His pain level is much improved compared to before surgery.  He is having pain exacerbations with activity.  I have recommended some physical therapy to help with his back discomfort.  This may also help him modify his activities so that he does not have his of any exacerbations.  I will touch base with him via telephone in 2 months.    I spent a total of 4 minutes in face-to-face and non-face-to-face activities related to this patient's care today.   Reeves Daisy MD, [facility] Department of Neurosurgery
